# Patient Record
Sex: Female | Born: 1955 | Race: White | Hispanic: No | Marital: Married | Smoking: Current every day smoker
Health system: Southern US, Community
[De-identification: ages and names within clinical notes are randomized; demographics above are authoritative.]

## PROBLEM LIST (undated history)

## (undated) DIAGNOSIS — K3184 Gastroparesis: Secondary | ICD-10-CM

## (undated) DIAGNOSIS — M542 Cervicalgia: Secondary | ICD-10-CM

## (undated) DIAGNOSIS — G43711 Chronic migraine without aura, intractable, with status migrainosus: Secondary | ICD-10-CM

## (undated) DIAGNOSIS — Z8489 Family history of other specified conditions: Secondary | ICD-10-CM

## (undated) DIAGNOSIS — F3289 Other specified depressive episodes: Secondary | ICD-10-CM

## (undated) DIAGNOSIS — R51 Headache: Secondary | ICD-10-CM

## (undated) DIAGNOSIS — I10 Essential (primary) hypertension: Secondary | ICD-10-CM

## (undated) DIAGNOSIS — K219 Gastro-esophageal reflux disease without esophagitis: Secondary | ICD-10-CM

## (undated) DIAGNOSIS — Z9889 Other specified postprocedural states: Secondary | ICD-10-CM

## (undated) DIAGNOSIS — J449 Chronic obstructive pulmonary disease, unspecified: Secondary | ICD-10-CM

## (undated) DIAGNOSIS — R42 Dizziness and giddiness: Secondary | ICD-10-CM

## (undated) DIAGNOSIS — R109 Unspecified abdominal pain: Secondary | ICD-10-CM

## (undated) DIAGNOSIS — M25473 Effusion, unspecified ankle: Secondary | ICD-10-CM

## (undated) DIAGNOSIS — G479 Sleep disorder, unspecified: Secondary | ICD-10-CM

## (undated) DIAGNOSIS — R519 Headache, unspecified: Secondary | ICD-10-CM

## (undated) DIAGNOSIS — F411 Generalized anxiety disorder: Secondary | ICD-10-CM

## (undated) DIAGNOSIS — J4489 Other specified chronic obstructive pulmonary disease: Secondary | ICD-10-CM

## (undated) DIAGNOSIS — E785 Hyperlipidemia, unspecified: Secondary | ICD-10-CM

## (undated) DIAGNOSIS — G932 Benign intracranial hypertension: Secondary | ICD-10-CM

## (undated) DIAGNOSIS — K0530 Chronic periodontitis, unspecified: Secondary | ICD-10-CM

## (undated) DIAGNOSIS — R112 Nausea with vomiting, unspecified: Secondary | ICD-10-CM

## (undated) DIAGNOSIS — F329 Major depressive disorder, single episode, unspecified: Secondary | ICD-10-CM

## (undated) DIAGNOSIS — Z972 Presence of dental prosthetic device (complete) (partial): Secondary | ICD-10-CM

## (undated) DIAGNOSIS — J189 Pneumonia, unspecified organism: Secondary | ICD-10-CM

## (undated) DIAGNOSIS — M810 Age-related osteoporosis without current pathological fracture: Secondary | ICD-10-CM

## (undated) DIAGNOSIS — K859 Acute pancreatitis without necrosis or infection, unspecified: Secondary | ICD-10-CM

## (undated) DIAGNOSIS — G8929 Other chronic pain: Secondary | ICD-10-CM

## (undated) DIAGNOSIS — IMO0002 Reserved for concepts with insufficient information to code with codable children: Secondary | ICD-10-CM

## (undated) HISTORY — DX: Unspecified abdominal pain: R10.9

## (undated) HISTORY — DX: Essential (primary) hypertension: I10

## (undated) HISTORY — DX: Chronic obstructive pulmonary disease, unspecified: J44.9

## (undated) HISTORY — DX: Generalized anxiety disorder: F41.1

## (undated) HISTORY — DX: Benign intracranial hypertension: G93.2

## (undated) HISTORY — DX: Cervicalgia: M54.2

## (undated) HISTORY — DX: Age-related osteoporosis without current pathological fracture: M81.0

## (undated) HISTORY — DX: Other chronic pain: G89.29

## (undated) HISTORY — DX: Gastroparesis: K31.84

## (undated) HISTORY — DX: Gastro-esophageal reflux disease without esophagitis: K21.9

## (undated) HISTORY — DX: Chronic migraine without aura, intractable, with status migrainosus: G43.711

## (undated) HISTORY — DX: Major depressive disorder, single episode, unspecified: F32.9

## (undated) HISTORY — DX: Reserved for concepts with insufficient information to code with codable children: IMO0002

## (undated) HISTORY — DX: Headache: R51

## (undated) HISTORY — DX: Other specified depressive episodes: F32.89

## (undated) HISTORY — DX: Other specified chronic obstructive pulmonary disease: J44.89

## (undated) HISTORY — PX: CHOLECYSTECTOMY: SHX55

## (undated) HISTORY — DX: Headache, unspecified: R51.9

## (undated) HISTORY — DX: Hyperlipidemia, unspecified: E78.5

---

## 1993-05-16 HISTORY — PX: TOTAL ABDOMINAL HYSTERECTOMY: SHX209

## 2003-05-17 HISTORY — PX: BREAST BIOPSY: SHX20

## 2003-06-13 ENCOUNTER — Encounter: Admission: RE | Admit: 2003-06-13 | Discharge: 2003-06-13 | Payer: Self-pay | Admitting: Dentistry

## 2003-06-26 ENCOUNTER — Ambulatory Visit (HOSPITAL_COMMUNITY): Admission: RE | Admit: 2003-06-26 | Discharge: 2003-06-26 | Payer: Self-pay | Admitting: Dentistry

## 2003-08-26 ENCOUNTER — Ambulatory Visit (HOSPITAL_COMMUNITY): Admission: RE | Admit: 2003-08-26 | Discharge: 2003-08-26 | Payer: Self-pay | Admitting: General Surgery

## 2003-08-26 ENCOUNTER — Encounter (INDEPENDENT_AMBULATORY_CARE_PROVIDER_SITE_OTHER): Payer: Self-pay | Admitting: Specialist

## 2003-08-26 ENCOUNTER — Ambulatory Visit (HOSPITAL_BASED_OUTPATIENT_CLINIC_OR_DEPARTMENT_OTHER): Admission: RE | Admit: 2003-08-26 | Discharge: 2003-08-26 | Payer: Self-pay | Admitting: General Surgery

## 2004-03-05 ENCOUNTER — Emergency Department: Payer: Self-pay | Admitting: Unknown Physician Specialty

## 2004-10-30 ENCOUNTER — Emergency Department: Payer: Self-pay | Admitting: Unknown Physician Specialty

## 2005-04-08 ENCOUNTER — Emergency Department: Payer: Self-pay | Admitting: Emergency Medicine

## 2005-09-13 ENCOUNTER — Other Ambulatory Visit: Payer: Self-pay

## 2005-09-13 ENCOUNTER — Inpatient Hospital Stay: Payer: Self-pay | Admitting: Internal Medicine

## 2005-09-14 ENCOUNTER — Other Ambulatory Visit: Payer: Self-pay

## 2005-11-13 ENCOUNTER — Other Ambulatory Visit: Payer: Self-pay

## 2005-11-13 ENCOUNTER — Emergency Department: Payer: Self-pay | Admitting: Emergency Medicine

## 2006-01-21 ENCOUNTER — Other Ambulatory Visit: Payer: Self-pay

## 2006-01-21 ENCOUNTER — Emergency Department: Payer: Self-pay | Admitting: Emergency Medicine

## 2006-03-02 ENCOUNTER — Inpatient Hospital Stay (HOSPITAL_COMMUNITY): Admission: EM | Admit: 2006-03-02 | Discharge: 2006-03-09 | Payer: Self-pay | Admitting: Psychiatry

## 2006-03-02 ENCOUNTER — Ambulatory Visit: Payer: Self-pay | Admitting: Psychiatry

## 2006-03-27 ENCOUNTER — Ambulatory Visit: Payer: Self-pay | Admitting: Psychiatry

## 2006-03-27 ENCOUNTER — Inpatient Hospital Stay (HOSPITAL_COMMUNITY): Admission: AD | Admit: 2006-03-27 | Discharge: 2006-04-12 | Payer: Self-pay | Admitting: Psychiatry

## 2006-04-06 ENCOUNTER — Encounter (HOSPITAL_COMMUNITY): Payer: Self-pay | Admitting: Psychiatry

## 2006-05-12 ENCOUNTER — Emergency Department: Payer: Self-pay | Admitting: Emergency Medicine

## 2006-08-15 ENCOUNTER — Emergency Department: Payer: Self-pay | Admitting: Emergency Medicine

## 2006-08-15 ENCOUNTER — Other Ambulatory Visit: Payer: Self-pay

## 2006-08-29 ENCOUNTER — Emergency Department: Payer: Self-pay | Admitting: Emergency Medicine

## 2006-08-29 ENCOUNTER — Other Ambulatory Visit: Payer: Self-pay

## 2006-09-23 ENCOUNTER — Emergency Department: Payer: Self-pay | Admitting: Unknown Physician Specialty

## 2006-10-24 ENCOUNTER — Emergency Department: Payer: Self-pay | Admitting: Emergency Medicine

## 2007-05-17 HISTORY — PX: OTHER SURGICAL HISTORY: SHX169

## 2007-09-17 ENCOUNTER — Ambulatory Visit: Payer: Self-pay | Admitting: Unknown Physician Specialty

## 2007-10-08 ENCOUNTER — Ambulatory Visit: Payer: Self-pay | Admitting: Urology

## 2008-01-16 ENCOUNTER — Encounter: Payer: Self-pay | Admitting: Internal Medicine

## 2008-01-17 ENCOUNTER — Encounter: Payer: Self-pay | Admitting: Internal Medicine

## 2008-01-18 ENCOUNTER — Encounter: Payer: Self-pay | Admitting: Internal Medicine

## 2008-06-06 ENCOUNTER — Ambulatory Visit: Payer: Self-pay | Admitting: Internal Medicine

## 2008-06-06 DIAGNOSIS — J984 Other disorders of lung: Secondary | ICD-10-CM

## 2008-06-06 DIAGNOSIS — F329 Major depressive disorder, single episode, unspecified: Secondary | ICD-10-CM

## 2008-06-06 LAB — CONVERTED CEMR LAB
Alkaline Phosphatase: 92 units/L (ref 39–117)
Bilirubin, Direct: 0.1 mg/dL (ref 0.0–0.3)
CO2: 29 meq/L (ref 19–32)
GFR calc Af Amer: 85 mL/min
Glucose, Bld: 101 mg/dL — ABNORMAL HIGH (ref 70–99)
INR: 1 (ref 0.8–1.0)
Lymphocytes Relative: 18.1 % (ref 12.0–46.0)
Monocytes Absolute: 0.4 10*3/uL (ref 0.1–1.0)
Monocytes Relative: 3.2 % (ref 3.0–12.0)
Platelets: 266 10*3/uL (ref 150–400)
Potassium: 4.1 meq/L (ref 3.5–5.1)
Prothrombin Time: 10.8 s — ABNORMAL LOW (ref 10.9–13.3)
RDW: 12 % (ref 11.5–14.6)
Sodium: 139 meq/L (ref 135–145)
Total Protein: 6.9 g/dL (ref 6.0–8.3)

## 2008-06-17 ENCOUNTER — Ambulatory Visit: Payer: Self-pay | Admitting: Cardiology

## 2008-06-17 ENCOUNTER — Ambulatory Visit: Payer: Self-pay | Admitting: Internal Medicine

## 2008-06-21 DIAGNOSIS — K219 Gastro-esophageal reflux disease without esophagitis: Secondary | ICD-10-CM | POA: Insufficient documentation

## 2009-01-20 ENCOUNTER — Ambulatory Visit: Payer: Self-pay | Admitting: Internal Medicine

## 2009-03-10 ENCOUNTER — Emergency Department: Payer: Self-pay | Admitting: Emergency Medicine

## 2009-08-31 ENCOUNTER — Observation Stay: Payer: Self-pay | Admitting: Specialist

## 2009-10-15 ENCOUNTER — Emergency Department: Payer: Self-pay | Admitting: Emergency Medicine

## 2009-12-17 ENCOUNTER — Emergency Department: Payer: Self-pay | Admitting: Emergency Medicine

## 2010-01-06 ENCOUNTER — Emergency Department: Payer: Self-pay | Admitting: Emergency Medicine

## 2010-01-20 ENCOUNTER — Encounter: Admission: RE | Admit: 2010-01-20 | Discharge: 2010-01-20 | Payer: Self-pay | Admitting: Neurology

## 2010-02-03 ENCOUNTER — Emergency Department: Payer: Self-pay | Admitting: Emergency Medicine

## 2010-04-14 ENCOUNTER — Emergency Department: Payer: Self-pay | Admitting: Emergency Medicine

## 2010-05-11 ENCOUNTER — Emergency Department: Payer: Self-pay | Admitting: Emergency Medicine

## 2010-05-19 ENCOUNTER — Ambulatory Visit: Payer: Self-pay | Admitting: Neurology

## 2010-05-21 ENCOUNTER — Ambulatory Visit: Payer: Self-pay | Admitting: Neurology

## 2010-09-13 ENCOUNTER — Emergency Department: Payer: Self-pay | Admitting: Internal Medicine

## 2010-10-01 NOTE — Op Note (Signed)
NAME:  Glenda [name], Glenda [name]                         ACCOUNT NO.:  [id number]   MEDICAL RECORD NO.:  [id number]                   PATIENT TYPE:  AMB   LOCATION:  DSC                                  FACILITY:  [facility]   PHYSICIAN:  Adolph Pollack, M.D.            DATE OF BIRTH:  [birth year]   DATE OF PROCEDURE:  08/26/2003  DATE OF DISCHARGE:                                 OPERATIVE REPORT   PREOPERATIVE DIAGNOSIS:  Chronic left groin abscess.   POSTOPERATIVE DIAGNOSIS:  Chronic left groin abscess.   OPERATION PERFORMED:  Excision of chronic left groin abscess.   SURGEON:  Adolph Pollack, M.D.   ANESTHESIA:  Local (1% lidocaine plus 0.5% Marcaine).   INDICATIONS FOR PROCEDURE:  Glenda [name] is [name] 55 year old female who had  problems with recurring abscess in the left groin area.  It was incised and  drained one time.  It continues to recur.  She now presents for excision of  the area.   DESCRIPTION OF PROCEDURE:  She was placed supine on the operating table and  hand in the groin area was shaved.  The area was marked with [name]  and sterilely prepped and draped.  Local anesthetic was infiltrated  superficially and deep.  An elliptical incision was made around the area  that was slightly indurated and carried down to the subcutaneous tissue and  then this was excised.  There was still some firm indurated tissue deep to  this which was excised sharply down to soft tissue.  The two specimens were  then sent to pathology.   Bleeding was controlled with direct pressure and interrupted Vicryl sutures.  Once hemostasis was adequate, the skin was closed loosely with interrupted 4-  0 Prolene simple stitches.  [name] dressing was applied.  The patient tolerated  the procedure well without apparent complications.  She was instructed to  keep the bandage on for three days, then could remove it and shower.  Plan  to see her back in the office in 10 to 14 days for follow-up or sooner if  she has problems.                                               Adolph Pollack, M.D.    Kari Baars  D:  08/26/2003  T:  08/26/2003  Job:  [id number]

## 2010-10-01 NOTE — H&P (Signed)
Glenda [name], Glenda [name]               ACCOUNT NO.:  [id number]   MEDICAL RECORD NO.:  [id number]          PATIENT TYPE:  IPS   LOCATION:  0304                          [facility]ILITY[facility]  [facility]   PHYSICIAN:  Anselm Jungling, MD  DATE OF BIRTH:  1955/10/08   DATE OF ADMISSION:  03/02/2006  DATE OF DISCHARGE:                         PSYCHIATRIC ADMISSION ASSESSMENT   55 year old married white female, voluntarily admit on March 02, 2006.   HISTORY OF PRESENT ILLNESS:  The patient presents with a history depression.  Her depression has been increasing over the past three months.  The patient  states that her depression is affecting her sleep.  The patient states that  she might be up all night and when she does go to bed, she does not want to  get up.  She is also experiencing rageful symptoms for no reason. Denies any  mood swings.  She states her appetite has been satisfactory.  The patient  was having suicidal thoughts with a plan to overdose. Her family got very  concerned and wanted the patient to be admitted for her increasing symptoms.  Her stressors, one of her stressors is the patient feels very guilty over  her son's history of bipolar disorder.   PAST HISTORY:  First admission to [facility].  She was  hospitalized 12 years ago at [facility] in [place] for depressive symptoms.  She has no current outpatient treatment.   SOCIAL HISTORY:  She is a 55 year old married white female, been married for  30 years, has two children, both adult. She lives with her husband.  She is  unemployed and is on disability for her psychiatric illness.   FAMILY HISTORY:  Her son is bipolar, was diagnosed two years ago and is  currently on Risperdal, Celexa and Klonopin. Her sister is also bipolar.   SOCIAL HISTORY:  The patient smokes a pack a day.  She denies any alcohol or  drug use.   Primary care Maxmilian Trostel is Dr. Heloise Ochoa in [place].  Medical  problems are chronic  migraines.   MEDICATIONS:  She has been on Prozac 60 mg since 1989, Klonopin 1 mg t.i.d.,  Nexium 40 mg daily, Topamax 75 mg, she takes that at 1800 for preventative  headache pain, Reglan 10 mg p.r.n., Vicodin 10/650, 1 p.o. q.4-6h.   DRUG ALLERGIES:  The patient reports no known allergies but states is unable  to takes Zomig or Imitrex.   REVIEW OF SYSTEMS:  Denies any fever or chills. Positive for insomnia.  No  chest pain or shortness breath.  No nausea or vomiting. Positive for reflux.  Positive for depression, positive for suicidal thoughts, and positive for  anxiety.   PHYSICAL EXAMINATION:  VITAL SIGNS:  Temperature is 97.9, 67 heart rate, 18 respirations, blood  pressure 113/62, 5 feet 4 inches tall, 158 pounds.  GENERAL:  A middle-aged female in no acute distress, negative  lymphadenopathy, frontal tenderness.  CHEST:  Clear, no wheezing.  BREAST:  Exam was deferred.  HEART:  Regular rate and rhythm.  No murmurs, gallops, or rubs.  ABDOMEN:  Soft, nontender  abdomen.  GU:  Exam was deferred.  EXTREMITIES:  The patient moves all extremities, 5+ against resistance.  No  clubbing, no edema.  SKIN:  Warm and dry without rashes or lacerations.  NEUROLOGICAL:  Findings are intact.  Able to easily perform heel-to-shin,  normal alternating movements.   Her CBC is within normal limits.  CMET within normal limits.  Her BUN is  somewhat low at 4 with an albumin at 3.3.  Urine and urine drug screen are  pending.   MENTAL STATUS EXAM:  She is fully alert, cooperative, fair eye contact.  She  is casually dressed.  Speech is clear, monotone, rambling at times.  Mood is  depressed.  The patient appears flat.  Thought process coherent.  No  evidence of psychosis.  Cognitively intact.  Memory is good.  Judgment is  good.  Insight is fair.  Average intelligence.  Concentration intact.   DIAGNOSIS:  AXIS I:           Major depressive disorder, recurrent, severe.  AXIS II:           Deferred.  AXIS III:         Migraine headaches and gastroesophageal reflux disease.  AXIS IV:          Psychosocial problems related to burden of illness, guilt  associated with son's history of bipolar disorder.  AXIS V:           Current is 35.   PLAN:  To stabilize mood.  Contract for safety.  We will decrease Prozac and  add Cymbalta.  Will have Seroquel for sleep, have Seroquel p.r.n. for  symptoms of irritability.  Will have a family session with husband for  support and concerns. Case manager is to obtain follow-up for the patient.  Tentative length of stay is 5-6 days.      Landry Corporal, N.P.      Anselm Jungling, MD  Electronically Signed    JO/MEDQ  D:  03/02/2006  T:  03/02/2006  Job:  [id number]

## 2010-10-01 NOTE — Discharge Summary (Signed)
NAMEKEVONA, LUPINACCI               ACCOUNT NO.:  [id number]   MEDICAL RECORD NO.:  [id number]          PATIENT TYPE:  IPS   LOCATION:  0504                          FACILITY:  [facility]   PHYSICIAN:  Geoffery Lyons, M.D.      DATE OF BIRTH:  May 25, 1955   DATE OF ADMISSION:  03/27/2006  DATE OF DISCHARGE:  04/12/2006                               DISCHARGE SUMMARY   CHIEF COMPLAINT AND PRESENT ILLNESS:  This was the third admission to  [facility] for this 54 year old white female, married,  voluntarily admitted.  Endorsed severe depression for three weeks since  discharge and has had suicidal thoughts for three days, planning on  overdosing on medication.  Sleep has improved on Seroquel but she is  anhedonic with motor slowing, difficulty completing her ADLs,  spontaneous crying spells, 1-2 per day, for two weeks, neurovegetative  signs, decreased appetite, lack of energy, unable to concentrate on  tasks, persistent thoughts of suicide, episodes of anger, not  necessarily triggered.   PAST PSYCHIATRIC HISTORY:  Dr. Fannie Knee at [facility].  Has been seen at  [facility].  Last admission March 02, 2006 through March 09, 2006, [place] in 1995 for depression.  Had been on Prozac and Wellbutrin.   ALCOHOL/DRUG HISTORY:  Denies active use of any substances.   MEDICAL HISTORY:  Noncontributory.   MEDICATIONS:  Topamax 100 mg for migraines, Cymbalta 30 mg per day,  Nexium 40 mg per day, Reglan 10 mg three times a day, Klonopin 0.5 mg  twice a day and 2 mg at bedtime, Seroquel 50 mg daily.   PHYSICAL EXAMINATION:  Performed and failed to show any acute findings.   LABORATORY DATA:  CBC with white blood cells 10.1, hemoglobin 14.3.  Liver enzymes positive for opioids.  Potassium 4.0, chloride 109,  glucose 83, BUN 5, creatinine 0.9, sodium 142.   MENTAL STATUS EXAM:  Fully alert, cooperative female.  Some psychomotor  retardation.  Speech slow production, soft tone.  Mood  depressed.  Affect depressed.  Thought process with suicidal ideation with plan to  overdose.  Cognition was well-preserved.   ADMISSION DIAGNOSES:  AXIS I:  Major depression, recurrent.  AXIS II:  No diagnosis.  AXIS III:  Gastroesophageal reflux and migraines.  AXIS IV:  Moderate.  AXIS V:  GAF upon admission 30; highest GAF in the last year 67.   HOSPITAL COURSE:  She was admitted.  She was started in individual and  group psychotherapy.  She was maintained on her medications.  Cymbalta  was placed at 60 mg per day.  She was started on Lamictal 25 mg.  Seroquel was increased to 100 mg at bedtime and then 150 mg.  She  admitted that, since discharge, she had not felt like she was getting  any better.  She has been switched from Prozac to Cymbalta.  Began to  feel worse again.  Unstoppable crying.  Did not have an appointment  until May 01, 2006.  Seroquel has helped sleep.  Lived with the  husband who is supportive.  Son was diagnosed bipolar  two years prior to  this admission.  Started complaining of migraines, no sleep, continued  to evidence the depression.  There was some psychomotor retardation.  Speech soft and slow.  Felt that she was not ready to leave last time  she was admitted.  Endorsed depression, unremitting.  She cannot  identify any particular triggers.  Supportive family, husband, grand-  kids.  Did endorse some decrease in her crying spells.  Endorsed no  energy, no motivation.  Did not want to leave the hospital before she  felt she was ready.  She endorsed no active suicidal ideation but she  was having a hard time with anxiety, sleep.  She endorsed that she was  better on the Klonopin 1 mg twice a day and 2 mg at bedtime.  Continued  to endorse headaches, saying that she would have been able to sleep  except for the headache would not allow her.  Endorsed that she had been  dealing with the headache for a long time.  Claimed nausea and vomiting.  We called  her primary care Ajayla Iglesias to call in the treatment for the  headache.  Her physician suggested Vicodin 10 mg/750 mg every six hours  as needed and possibly increasing the Topamax.  She continued to have  the headache, no relief.  Felt that the next step that worked for her  was the Dilaudid and the Phenergan.  We went ahead and tried Dilaudid 4  mg and Phenergan 25 mg x1.  CT scan was ordered.  That did not show any  acute findings.  There was some nausea and some vomiting.  She was  assessed to have possibly a UTI, become malaise, gastrointestinal  distress.  We pursued further evaluation and treatment.  Multiple  somatic complaints.  She claimed that she always felt this way and she  developed some lightheadedness.  We started Seroquel.  We started some  Remeron.  On April 11, 2006, she developed urinary retention.  She  had to be catheterized.  Feeling was that possibly the Cymbalta was the  cause for the urinary retention.  After the catheter was placed.  She  was able to remove the catheter and void by herself.  On April 12, 2006, she was better.  Full contact with reality.  No suicidal or  homicidal ideation.  No delusions.  She felt better than the last time  that she was discharged.  Willing to pursue these medications.  Overall,  her mood improved.  Her affect was brighter.  As she was not suicidal,  we went ahead and discharged to outpatient follow-up.   DISCHARGE DIAGNOSES:  AXIS I:  Major depression, recurrent.  AXIS II:  No diagnosis.  AXIS III:  Migraines, gastroesophageal reflux, status post urinary  retention possibly secondary to Cymbalta.  AXIS IV:  Moderate.  AXIS V:  GAF upon discharge 55-60.   DISCHARGE MEDICATIONS:  1. Topamax 100 mg in the morning.  2. Klonopin 1 mg twice a day and 2 mg at bedtime.  3. Reglan 10 mg daily.  4. Lamictal 25 mg per day.  5. Nexium 40 mg per day.  6. Vicodin 5/500 mg, 1 four times a day as needed. 7. Seroquel 50 mg, 3 at  bedtime.  8. Remeron 45 mg SolTab at bedtime.  9. MiraLax 17 grams daily.  10.Senokot S 1 tab at night.  11.Ambien 10 mg at bedtime for sleep.   FOLLOWUP:  [facility] in [place], [place].  Geoffery Lyons, M.D.  Electronically Signed     IL/MEDQ  D:  05/01/2006  T:  05/01/2006  Job:  [id number]

## 2010-10-01 NOTE — Op Note (Signed)
NAME:  Glenda [name], Glenda [name]                         ACCOUNT NO.:  [id number]   MEDICAL RECORD NO.:  [id number]                   PATIENT TYPE:  AMB   LOCATION:  DAY                                  [name]   PHYSICIAN:  Charlynne Pander, D.D.S.          DATE OF BIRTH:  [birth year]   DATE OF PROCEDURE:  06/26/2003  DATE OF DISCHARGE:                                 OPERATIVE REPORT   PREOPERATIVE DIAGNOSES:  1. Panic disorder.  2. Mitral valve prolapse with regurgitation.  3. Chronic periodontitis.  4. Retained root segment, tooth #5.  5. Malocclusion.  6. Maxillary left tuberosity which was hanging down into the ideal occlusal     plane.   POSTOPERATIVE DIAGNOSES:  1. Panic disorder.  2. Mitral valve prolapse with regurgitation.  3. Chronic periodontitis.  4. Retained root segment, tooth #5.  5. Malocclusion.  6. Maxillary left tuberosity which was hanging down into the ideal occlusal     plane.   OPERATION:  1. Dental examination.  2. Surgical extraction of tooth numbers 3, 5, 13.  3. Two quadrants of alveoplasty.  4. Maxillary left tuberosity reduction.   SURGEON:  Charlynne Pander, D.D.S.   ASSISTANT:  Elliot Dally (Sales executive).   ANESTHESIA:  Monitored anesthesia care which per the anesthesia team.   MEDICATIONS:  1. Clindamycin 600 mg IV prior to invasive dental procedures.  2. Local anesthesia with total utilization of 3 carpules each containing 36     mg of Xylocaine with 0.018 mg of epinephrine as well as 2 carpules each     containing 0 mg of Marcaine with 0.009 mg of epinephrine.   SPECIMENS:  There were 3 teeth which were discarded.   CULTURES:  None.   DRAINS:  None.   COMPLICATIONS:  None.   ESTIMATED BLOOD LOSS:  50 mL.   FLUIDS:  600 mL of lactated Ringer's solution.   INDICATIONS:  The patient had [name] history of [name] panic disorder and mitral valve  prolapse with regurgitation.  The patient was evaluated and treatment  planned to  rule out dental infection which may affect the patient's systemic  health.  The patient was treatment planned for extraction of tooth numbers  3, 5, and 13 with alveoplasty and preprosthetic surgery as indicated.  This  treatment plan was formulated to decrease the risks and complications  associated with dental infection from affecting the patient's systemic  health as well as to assist in prosthodontics rehabilitation for the  patient.   OPERATIVE FINDINGS:  The patient was examined in operating room #6.  The  teeth were identified for extraction.  The patient was noted to be affected  by chronic periodontal disease, presence of retained root segment #5, poor  occlusal scheme, malocclusion.  The aforementioned necessitated the removal  of tooth #3, 5, and 13 with alveoplasty and preprosthetic surgery as  indicated.   DESCRIPTION OF PROCEDURE:  The  patient was brought to the main operating  room #6.  The patient was placed in supine position on the operating room  table.  The patient then had monitored anesthesia care induced per the  anesthesia team.  The oral cavity was thoroughly examined with the findings  as noted above.  The patient was then ready for the oral surgical procedure  as follows:   Local anesthesia was administered sequentially with [name] total utilization of 3  carpules each containing 36 mg of Xylocaine with 0.018 mg of epinephrine as  well as 2 carpules each containing 9 mg of Marcaine with 0.009 mg of  epinephrine.  This local anesthesia was administered sequentially over the 1  hour long procedure.   The maxillary quadrants are first approached.  Anesthesia was delivered as  previously described.  The maxillary right corner was first approached.  [name]  15 blade incision was made from the distal of #2 through the mesial of #6.  Surgical flap was then reflected.  The retained root segment #5 was then  noted.  [name] surgical hand-piece and bur and copious amounts of sterile  saline  was utilized to remove buccal and interseptal bone around tooth #3 and  retained root segment #5 at this time.  The retained root segment was then  elevated out with an elevator without complications.  The surgical hand-  piece and bur were then utilized to section tooth #3 into 3 segments.  These  were elevated out appropriately.  This left [name] retained root tip in the area  of the distal buccal.  This was then elevated out with [name] root tip pick  without complications.  Significant alveoplasty was then performed utilizing  [name] rongeurs and bone file.  The tissues were approximated and trimmed  appropriately.  The surgical site was then irrigated with copious amounts of  sterile saline x 2.  [name] periodontal interrupted suture was then placed on the  mesial aspect of tooth #2 x 1.  The remaining surgical site was then closed  utilizing 3-0 chromic gut suture in [name] continuous interrupted suture  technique from the mesial of #2 through the distal of #6.  [name] separate  periodontal interrupted suture was then placed on the distal aspect of tooth  #6 x 1.   The maxillary left quadrant was then approached.  Anesthesia was given as  previously delivered.  [name] 15 blade incision was made from the distal of the  tuberosity through the distal of tooth #11.  [name] surgical flap was then  reflected. An appropriate amount of buccal and interseptal bone was removed  around tooth #13 at this time.  This was then removed with the 150 forceps  without complication.  Alveoplasty was then performed to the maxillary left  surgical site as indicated.  [name] soft tissue maxillary tuberosity reduction  was then performed on the left side as indicated.  [name] 15 blade and soft  tissue pick-ups was utilized to perform this.  The soft tissue scissors was  then utilized to further recontour the soft tissues as indicated.  The  surgical site was then irrigated with copious amounts of sterile saline. The tissues were approximated and  again trimmed as needed.  The surgical  site was then closed on the mesial aspect by placing 2 interrupted sutures  at the distal of tooth #11.  The remaining surgical site was then closed  utilizing 3-0 chromic gut suture in [name] continuous interrupted suture  technique from the distal  of the tuberosity to the distal of #11  appropriately.   The entire mouth was then irrigated with copious amounts of sterile saline.  The patient was examined for complications, seeing none, the dental medicine  procedure was deemed to be complete.  [name] series of 4 x 4 gauzes were placed  in the patient's mouth to aid hemostasis at this time.  The patient was then  handed over to the anesthesia team for final disposition.  After an  appropriate amount of time, the patient was taken to the postanesthesia care  unit with stable vital signs and [name] good oxygenation level.  All counts were  correct for the dental medicine procedure.   The patient will follow up with dental medicine in approximately 1 week for  evaluation for suture removal.  The patient will be given appropriate pain  medication with Fiorinal with codeine No. 3 as indicated.  The patient was  noted to have an abscess in the left inguinal area.  The patient has been  instructed to follow up with [facility] as indicated.  In the meantime, the patient will maintain her  Augmentin therapy as prescribed by Dr. Nolon Rod Monguilod at the time of  the physical exam prior to the oral surgical procedure.                                               Charlynne Pander, D.D.S.    RFK/MEDQ  D:  06/26/2003  T:  06/26/2003  Job:  [id number]   cc:   Leeanne Mannan, MD  [facility].   Rosanne Sack, M.D.  [address]  [place], [place] [address]  Fax: [phone]

## 2010-10-01 NOTE — Discharge Summary (Signed)
[name], [name]               ACCOUNT NO.:  [id number]   MEDICAL RECORD NO.:  [id number]          PATIENT TYPE:  IPS   LOCATION:  0304                          FACILITY:  [facility]   PHYSICIAN:  Anselm Jungling, MD  DATE OF BIRTH:  04/03/1956   DATE OF ADMISSION:  03/02/2006  DATE OF DISCHARGE:  03/09/2006                                 DISCHARGE SUMMARY   IDENTIFYING DATA/REASON FOR ADMISSION:  This was an inpatient psychiatric  admission for Glenda [name], a 55 year old married white female admitted with  increasing chronic depression, and suicidal ideation with plan.  Her  depression had been increasing over the previous three months, and was  affecting her sleep severely.  She was also experiencing increased  irritability for no reason that she could identify.  She denied mood swings.  Her suicidal plan involved the thought of overdosing.  Her family got very  concerned and wanted her admitted on an inpatient basis for these increasing  symptoms.  It was noted that her son has a history of bipolar disorder.  This was her first admission to our [facility].  She was  hospitalized psychiatrically 12 years prior at [facility] for  depressive symptoms.  She was not involved in any outpatient treatment.  However, she came to [place] on a combination of Prozac, Topamax and Klonopin,  from her primary care physician.  Please refer to the admission note for  further details pertaining to the symptoms, circumstances and history that  led to her hospitalization.   INITIAL DIAGNOSTIC IMPRESSION:  She was given initial AXIS I diagnosis of  major depressive disorder, recurrent without psychotic features, and  insomnia.   MEDICAL/LABORATORY:  The patient was medically and physically assessed by  the psychiatric nurse practitioner.  She came to [place] on Nexium and Reglan for  GERD, and Vicodin as needed for pain.  She had a history of GERD.  She had a  history of migraine headaches.   There were no acute medical issues during  this brief inpatient stay, although she did have some problems with headache  which were addressed with appropriate medication successfully.   HOSPITAL COURSE:  The patient was admitted to the adult inpatient  psychiatric service.  She presented as a well-nourished, well-developed and  well-organized woman who was fully oriented.  She was a pleasant and  cooperative individual, but depressed, and sad with constricted affect.  There were no signs or symptoms of psychosis.  She denied any suicidal  thought or intention during her entire stay.   The patient had been taking Prozac, but apparently without good results.  As  a result, a decision was made to do a trial of Cymbalta which might also  help to address underlying chronic pain.  She was started on Cymbalta 30 mg  daily.  Prozac was discontinued.  She was continued on Klonopin 0.5 mg  b.i.d. and 2 mg q.h.s..  Topamax was continued, with a slightly increased  dose to 100 mg daily.  These were the only psychotropic medication changes  affected.   The patient participated in  various therapeutic groups and activities.  She  demonstrated good insight and judgment, but had difficulty making progress  with respect to her mood.  Throughout her stay, she complained of being  worried and anxious about how she would do at home following her discharge.   On the eighth hospital day, the patient was felt to be appropriate for  discharge.  Although she still had some anxieties about returning home, she  agreed with [place] that continuing her inpatient stay was not likely to change  her level of apprehension, which to some degree was natural and appropriate.  She was absent suicidal ideation, was eating and sleeping reasonably well.   AFTERCARE:  The patient was to follow up at [facility], with an  appointment with Lorre Nick on March 16, 2006.   DISCHARGE MEDICATIONS:  1. Klonopin 0.5 mg  twice daily and 2 mg q.h.s.  2. Topamax 100 mg daily.  3. Cymbalta 30 mg daily.  4. Seroquel 50 mg q.h.s.  5. Nexium 40 mg daily.   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  AXIS II:  Deferred.  AXIS III:  History of gastroesophageal reflux disease, chronic pain.  AXIS IV:  Stressors:  Severe.  AXIS V:  GAF on discharge 70.      Anselm Jungling, MD  Electronically Signed     SPB/MEDQ  D:  03/10/2006  T:  03/11/2006  Job:  [id number]

## 2011-06-23 DIAGNOSIS — S0993XA Unspecified injury of face, initial encounter: Secondary | ICD-10-CM | POA: Diagnosis not present

## 2011-06-23 DIAGNOSIS — M542 Cervicalgia: Secondary | ICD-10-CM | POA: Diagnosis not present

## 2011-06-23 DIAGNOSIS — R197 Diarrhea, unspecified: Secondary | ICD-10-CM | POA: Diagnosis not present

## 2011-06-23 DIAGNOSIS — K3580 Unspecified acute appendicitis: Secondary | ICD-10-CM | POA: Diagnosis not present

## 2011-06-23 DIAGNOSIS — S199XXA Unspecified injury of neck, initial encounter: Secondary | ICD-10-CM | POA: Diagnosis not present

## 2011-06-23 DIAGNOSIS — R52 Pain, unspecified: Secondary | ICD-10-CM | POA: Diagnosis not present

## 2011-06-23 DIAGNOSIS — R109 Unspecified abdominal pain: Secondary | ICD-10-CM | POA: Diagnosis not present

## 2011-06-23 DIAGNOSIS — R6889 Other general symptoms and signs: Secondary | ICD-10-CM | POA: Diagnosis not present

## 2011-06-23 LAB — URINALYSIS, COMPLETE
Glucose,UR: NEGATIVE mg/dL (ref 0–75)
Ketone: NEGATIVE
Leukocyte Esterase: NEGATIVE
Nitrite: NEGATIVE
Ph: 6 (ref 4.5–8.0)
Protein: NEGATIVE
RBC,UR: <1 /HPF (ref 0–5)
Squamous Epithelial: <1
WBC UR: NONE SEEN /HPF (ref 0–5)

## 2011-06-23 LAB — COMPREHENSIVE METABOLIC PANEL
Alkaline Phosphatase: 103 U/L (ref 50–136)
Anion Gap: 11 (ref 7–16)
BUN: 7 mg/dL (ref 7–18)
Calcium, Total: 9.3 mg/dL (ref 8.5–10.1)
Chloride: 105 mmol/L (ref 98–107)
EGFR (African American): >60
Glucose: 79 mg/dL (ref 65–99)
Potassium: 4.4 mmol/L (ref 3.5–5.1)
SGOT(AST): 23 U/L (ref 15–37)
SGPT (ALT): 20 U/L
Total Protein: 7.7 g/dL (ref 6.4–8.2)

## 2011-06-23 LAB — DRUG SCREEN, URINE
Amphetamines, Ur Screen: NEGATIVE (ref ?–1000)
Benzodiazepine, Ur Scrn: NEGATIVE (ref ?–200)
Cocaine Metabolite,Ur ~~LOC~~: NEGATIVE (ref ?–300)
MDMA (Ecstasy)Ur Screen: NEGATIVE (ref ?–500)
Tricyclic, Ur Screen: NEGATIVE (ref ?–1000)

## 2011-06-23 LAB — ETHANOL
Ethanol %: <0.003 % (ref 0.000–0.080)
Ethanol: <3 mg/dL

## 2011-06-23 LAB — CK-MB: CK-MB: <0.5 ng/mL — ABNORMAL LOW (ref 0.5–3.6)

## 2011-06-23 LAB — CBC
HGB: 15.1 g/dL (ref 12.0–16.0)
MCH: 32.1 pg (ref 26.0–34.0)
MCV: 95 fL (ref 80–100)
Platelet: 253 10*3/uL (ref 150–440)
RBC: 4.69 10*6/uL (ref 3.80–5.20)
WBC: 9 10*3/uL (ref 3.6–11.0)

## 2011-06-23 LAB — CK: CK, Total: 71 U/L (ref 21–215)

## 2011-06-24 ENCOUNTER — Inpatient Hospital Stay: Payer: Self-pay | Admitting: Psychiatry

## 2011-06-24 DIAGNOSIS — R109 Unspecified abdominal pain: Secondary | ICD-10-CM | POA: Diagnosis not present

## 2011-06-24 DIAGNOSIS — R197 Diarrhea, unspecified: Secondary | ICD-10-CM | POA: Diagnosis not present

## 2011-06-24 DIAGNOSIS — R52 Pain, unspecified: Secondary | ICD-10-CM | POA: Diagnosis not present

## 2011-06-24 DIAGNOSIS — F312 Bipolar disorder, current episode manic severe with psychotic features: Secondary | ICD-10-CM | POA: Diagnosis not present

## 2011-06-24 DIAGNOSIS — K3580 Unspecified acute appendicitis: Secondary | ICD-10-CM | POA: Diagnosis not present

## 2011-06-25 DIAGNOSIS — F312 Bipolar disorder, current episode manic severe with psychotic features: Secondary | ICD-10-CM | POA: Diagnosis not present

## 2011-06-26 DIAGNOSIS — F312 Bipolar disorder, current episode manic severe with psychotic features: Secondary | ICD-10-CM | POA: Diagnosis not present

## 2011-06-27 DIAGNOSIS — F312 Bipolar disorder, current episode manic severe with psychotic features: Secondary | ICD-10-CM | POA: Diagnosis not present

## 2011-06-28 DIAGNOSIS — F312 Bipolar disorder, current episode manic severe with psychotic features: Secondary | ICD-10-CM | POA: Diagnosis not present

## 2011-07-29 DIAGNOSIS — Z79899 Other long term (current) drug therapy: Secondary | ICD-10-CM | POA: Diagnosis not present

## 2011-07-29 DIAGNOSIS — F17200 Nicotine dependence, unspecified, uncomplicated: Secondary | ICD-10-CM | POA: Diagnosis not present

## 2011-07-29 DIAGNOSIS — E538 Deficiency of other specified B group vitamins: Secondary | ICD-10-CM | POA: Diagnosis not present

## 2011-07-29 DIAGNOSIS — E7800 Pure hypercholesterolemia, unspecified: Secondary | ICD-10-CM | POA: Diagnosis not present

## 2011-07-29 DIAGNOSIS — J449 Chronic obstructive pulmonary disease, unspecified: Secondary | ICD-10-CM | POA: Diagnosis not present

## 2011-07-29 DIAGNOSIS — R5383 Other fatigue: Secondary | ICD-10-CM | POA: Diagnosis not present

## 2011-07-29 DIAGNOSIS — R5381 Other malaise: Secondary | ICD-10-CM | POA: Diagnosis not present

## 2011-08-01 DIAGNOSIS — Z79899 Other long term (current) drug therapy: Secondary | ICD-10-CM | POA: Diagnosis not present

## 2011-11-16 DIAGNOSIS — E785 Hyperlipidemia, unspecified: Secondary | ICD-10-CM | POA: Diagnosis not present

## 2011-11-16 DIAGNOSIS — F329 Major depressive disorder, single episode, unspecified: Secondary | ICD-10-CM | POA: Diagnosis not present

## 2011-11-16 DIAGNOSIS — R51 Headache: Secondary | ICD-10-CM | POA: Diagnosis not present

## 2011-11-16 DIAGNOSIS — Z780 Asymptomatic menopausal state: Secondary | ICD-10-CM | POA: Diagnosis not present

## 2012-01-10 DIAGNOSIS — E785 Hyperlipidemia, unspecified: Secondary | ICD-10-CM | POA: Diagnosis not present

## 2012-01-10 DIAGNOSIS — R51 Headache: Secondary | ICD-10-CM | POA: Diagnosis not present

## 2012-01-10 DIAGNOSIS — G44229 Chronic tension-type headache, not intractable: Secondary | ICD-10-CM | POA: Diagnosis not present

## 2012-01-10 DIAGNOSIS — G4440 Drug-induced headache, not elsewhere classified, not intractable: Secondary | ICD-10-CM | POA: Diagnosis not present

## 2012-01-10 DIAGNOSIS — IMO0001 Reserved for inherently not codable concepts without codable children: Secondary | ICD-10-CM | POA: Diagnosis not present

## 2012-01-10 DIAGNOSIS — F329 Major depressive disorder, single episode, unspecified: Secondary | ICD-10-CM | POA: Diagnosis not present

## 2012-04-10 DIAGNOSIS — Z23 Encounter for immunization: Secondary | ICD-10-CM | POA: Diagnosis not present

## 2012-04-10 DIAGNOSIS — E7800 Pure hypercholesterolemia, unspecified: Secondary | ICD-10-CM | POA: Diagnosis not present

## 2012-05-14 ENCOUNTER — Inpatient Hospital Stay: Payer: Self-pay | Admitting: Psychiatry

## 2012-05-14 DIAGNOSIS — R45851 Suicidal ideations: Secondary | ICD-10-CM | POA: Diagnosis not present

## 2012-05-14 DIAGNOSIS — F39 Unspecified mood [affective] disorder: Secondary | ICD-10-CM | POA: Diagnosis not present

## 2012-05-14 DIAGNOSIS — F329 Major depressive disorder, single episode, unspecified: Secondary | ICD-10-CM | POA: Diagnosis not present

## 2012-05-14 LAB — ETHANOL
Ethanol %: <0.003 % (ref 0.000–0.080)
Ethanol: <3 mg/dL

## 2012-05-14 LAB — CBC
HCT: 43.6 % (ref 35.0–47.0)
HGB: 14.6 g/dL (ref 12.0–16.0)
MCHC: 33.5 g/dL (ref 32.0–36.0)
MCV: 94 fL (ref 80–100)
RBC: 4.65 10*6/uL (ref 3.80–5.20)
WBC: 7.8 10*3/uL (ref 3.6–11.0)

## 2012-05-14 LAB — URINALYSIS, COMPLETE
Bacteria: NONE SEEN
Bilirubin,UR: NEGATIVE
Glucose,UR: NEGATIVE mg/dL (ref 0–75)
Ketone: NEGATIVE
Leukocyte Esterase: NEGATIVE
RBC,UR: 1 /HPF (ref 0–5)
WBC UR: <1 /HPF (ref 0–5)

## 2012-05-14 LAB — DRUG SCREEN, URINE
Barbiturates, Ur Screen: POSITIVE (ref ?–200)
Benzodiazepine, Ur Scrn: NEGATIVE (ref ?–200)
Cocaine Metabolite,Ur ~~LOC~~: NEGATIVE (ref ?–300)
MDMA (Ecstasy)Ur Screen: NEGATIVE (ref ?–500)
Methadone, Ur Screen: NEGATIVE (ref ?–300)
Opiate, Ur Screen: NEGATIVE (ref ?–300)

## 2012-05-14 LAB — COMPREHENSIVE METABOLIC PANEL
Anion Gap: 8 (ref 7–16)
BUN: 5 mg/dL — ABNORMAL LOW (ref 7–18)
Bilirubin,Total: <0.1 mg/dL — ABNORMAL LOW (ref 0.2–1.0)
Calcium, Total: 8.5 mg/dL (ref 8.5–10.1)
Chloride: 105 mmol/L (ref 98–107)
Creatinine: 1.05 mg/dL (ref 0.60–1.30)
EGFR (African American): >60
Glucose: 93 mg/dL (ref 65–99)
Potassium: 3.7 mmol/L (ref 3.5–5.1)
SGOT(AST): 17 U/L (ref 15–37)
Total Protein: 7.2 g/dL (ref 6.4–8.2)

## 2012-05-14 LAB — SALICYLATE LEVEL: Salicylates, Serum: 2.2 mg/dL

## 2012-05-14 LAB — ACETAMINOPHEN LEVEL: Acetaminophen: 16 ug/mL

## 2012-05-15 DIAGNOSIS — F39 Unspecified mood [affective] disorder: Secondary | ICD-10-CM | POA: Diagnosis not present

## 2012-05-17 DIAGNOSIS — F39 Unspecified mood [affective] disorder: Secondary | ICD-10-CM | POA: Diagnosis not present

## 2012-06-05 DIAGNOSIS — F332 Major depressive disorder, recurrent severe without psychotic features: Secondary | ICD-10-CM | POA: Diagnosis not present

## 2012-06-09 ENCOUNTER — Emergency Department: Payer: Self-pay | Admitting: Internal Medicine

## 2012-06-09 DIAGNOSIS — G319 Degenerative disease of nervous system, unspecified: Secondary | ICD-10-CM | POA: Diagnosis not present

## 2012-06-09 DIAGNOSIS — R51 Headache: Secondary | ICD-10-CM | POA: Diagnosis not present

## 2012-06-09 DIAGNOSIS — G43909 Migraine, unspecified, not intractable, without status migrainosus: Secondary | ICD-10-CM | POA: Diagnosis not present

## 2012-06-17 ENCOUNTER — Emergency Department: Payer: Self-pay | Admitting: Emergency Medicine

## 2012-06-17 DIAGNOSIS — R51 Headache: Secondary | ICD-10-CM | POA: Diagnosis not present

## 2012-06-17 LAB — CSF CELL CT + PROT + GLU PANEL
CSF Tube #: 1
Eosinophil: 0 %
Lymphocytes: 83 %
Monocytes/Macrophages: 17 %
Neutrophils: 0 %
Other Cells: 0 %
Protein, CSF: 30 mg/dL (ref 15–45)
RBC (CSF): 7 /mm3
WBC (CSF): 2 /mm3

## 2012-06-17 LAB — CSF CELL COUNT WITH DIFFERENTIAL
Eosinophil: 0 %
Lymphocytes: 0 %
Monocytes/Macrophages: 0 %
Neutrophils: 0 %
RBC (CSF): 2 /mm3
WBC (CSF): 0 /mm3

## 2012-06-20 LAB — CSF CULTURE

## 2012-06-25 DIAGNOSIS — G4440 Drug-induced headache, not elsewhere classified, not intractable: Secondary | ICD-10-CM | POA: Diagnosis not present

## 2012-06-25 DIAGNOSIS — M542 Cervicalgia: Secondary | ICD-10-CM | POA: Diagnosis not present

## 2012-06-26 DIAGNOSIS — G4440 Drug-induced headache, not elsewhere classified, not intractable: Secondary | ICD-10-CM | POA: Diagnosis not present

## 2012-06-26 DIAGNOSIS — M542 Cervicalgia: Secondary | ICD-10-CM | POA: Diagnosis not present

## 2012-07-31 DIAGNOSIS — I10 Essential (primary) hypertension: Secondary | ICD-10-CM | POA: Diagnosis not present

## 2012-07-31 DIAGNOSIS — E049 Nontoxic goiter, unspecified: Secondary | ICD-10-CM | POA: Diagnosis not present

## 2012-07-31 DIAGNOSIS — F3289 Other specified depressive episodes: Secondary | ICD-10-CM | POA: Diagnosis not present

## 2012-07-31 DIAGNOSIS — R5383 Other fatigue: Secondary | ICD-10-CM | POA: Diagnosis not present

## 2012-07-31 DIAGNOSIS — F17200 Nicotine dependence, unspecified, uncomplicated: Secondary | ICD-10-CM | POA: Diagnosis not present

## 2012-07-31 DIAGNOSIS — Z0000 Encounter for general adult medical examination without abnormal findings: Secondary | ICD-10-CM | POA: Diagnosis not present

## 2012-07-31 DIAGNOSIS — R5381 Other malaise: Secondary | ICD-10-CM | POA: Diagnosis not present

## 2012-08-31 DIAGNOSIS — J449 Chronic obstructive pulmonary disease, unspecified: Secondary | ICD-10-CM | POA: Diagnosis not present

## 2012-08-31 DIAGNOSIS — I10 Essential (primary) hypertension: Secondary | ICD-10-CM | POA: Diagnosis not present

## 2012-08-31 DIAGNOSIS — G540 Brachial plexus disorders: Secondary | ICD-10-CM | POA: Diagnosis not present

## 2012-08-31 DIAGNOSIS — J4489 Other specified chronic obstructive pulmonary disease: Secondary | ICD-10-CM | POA: Diagnosis not present

## 2012-09-14 DIAGNOSIS — R109 Unspecified abdominal pain: Secondary | ICD-10-CM | POA: Diagnosis not present

## 2012-09-14 DIAGNOSIS — F17200 Nicotine dependence, unspecified, uncomplicated: Secondary | ICD-10-CM | POA: Diagnosis not present

## 2012-10-03 DIAGNOSIS — R011 Cardiac murmur, unspecified: Secondary | ICD-10-CM | POA: Diagnosis not present

## 2012-10-03 DIAGNOSIS — R109 Unspecified abdominal pain: Secondary | ICD-10-CM | POA: Diagnosis not present

## 2012-10-03 DIAGNOSIS — R51 Headache: Secondary | ICD-10-CM | POA: Diagnosis not present

## 2012-10-03 DIAGNOSIS — R112 Nausea with vomiting, unspecified: Secondary | ICD-10-CM | POA: Diagnosis not present

## 2012-10-03 DIAGNOSIS — R079 Chest pain, unspecified: Secondary | ICD-10-CM | POA: Diagnosis not present

## 2012-10-03 DIAGNOSIS — I517 Cardiomegaly: Secondary | ICD-10-CM | POA: Diagnosis not present

## 2012-10-04 ENCOUNTER — Emergency Department: Payer: Self-pay | Admitting: Emergency Medicine

## 2012-10-04 DIAGNOSIS — Z9089 Acquired absence of other organs: Secondary | ICD-10-CM | POA: Diagnosis not present

## 2012-10-04 DIAGNOSIS — R109 Unspecified abdominal pain: Secondary | ICD-10-CM | POA: Diagnosis not present

## 2012-10-04 DIAGNOSIS — R9431 Abnormal electrocardiogram [ECG] [EKG]: Secondary | ICD-10-CM | POA: Diagnosis not present

## 2012-10-04 DIAGNOSIS — R6889 Other general symptoms and signs: Secondary | ICD-10-CM | POA: Diagnosis not present

## 2012-10-04 DIAGNOSIS — R52 Pain, unspecified: Secondary | ICD-10-CM | POA: Diagnosis not present

## 2012-10-04 LAB — URINALYSIS, COMPLETE
Bilirubin,UR: NEGATIVE
Glucose,UR: NEGATIVE mg/dL (ref 0–75)
Ph: 6 (ref 4.5–8.0)
RBC,UR: <1 /HPF (ref 0–5)
Specific Gravity: 1.002 (ref 1.003–1.030)
Squamous Epithelial: <1
WBC UR: <1 /HPF (ref 0–5)

## 2012-10-04 LAB — CBC
HCT: 39.7 % (ref 35.0–47.0)
HGB: 13.8 g/dL (ref 12.0–16.0)
MCHC: 34.7 g/dL (ref 32.0–36.0)
MCV: 93 fL (ref 80–100)
Platelet: 279 10*3/uL (ref 150–440)
RBC: 4.28 10*6/uL (ref 3.80–5.20)
WBC: 9 10*3/uL (ref 3.6–11.0)

## 2012-10-04 LAB — COMPREHENSIVE METABOLIC PANEL
Albumin: 3.9 g/dL (ref 3.4–5.0)
Alkaline Phosphatase: 108 U/L (ref 50–136)
BUN: 4 mg/dL — ABNORMAL LOW (ref 7–18)
Chloride: 101 mmol/L (ref 98–107)
Creatinine: 0.91 mg/dL (ref 0.60–1.30)
EGFR (African American): >60
EGFR (Non-African Amer.): >60
Glucose: 91 mg/dL (ref 65–99)
Potassium: 3.4 mmol/L — ABNORMAL LOW (ref 3.5–5.1)
SGOT(AST): 17 U/L (ref 15–37)

## 2012-10-04 LAB — LIPASE, BLOOD: Lipase: 106 U/L (ref 73–393)

## 2012-10-11 DIAGNOSIS — R109 Unspecified abdominal pain: Secondary | ICD-10-CM | POA: Diagnosis not present

## 2012-10-11 DIAGNOSIS — K5900 Constipation, unspecified: Secondary | ICD-10-CM | POA: Diagnosis not present

## 2012-10-11 DIAGNOSIS — R112 Nausea with vomiting, unspecified: Secondary | ICD-10-CM | POA: Diagnosis not present

## 2012-10-11 DIAGNOSIS — K219 Gastro-esophageal reflux disease without esophagitis: Secondary | ICD-10-CM | POA: Diagnosis not present

## 2012-10-17 DIAGNOSIS — K2940 Chronic atrophic gastritis without bleeding: Secondary | ICD-10-CM | POA: Diagnosis not present

## 2012-10-17 DIAGNOSIS — K449 Diaphragmatic hernia without obstruction or gangrene: Secondary | ICD-10-CM | POA: Diagnosis not present

## 2012-10-17 DIAGNOSIS — R112 Nausea with vomiting, unspecified: Secondary | ICD-10-CM | POA: Diagnosis not present

## 2012-10-17 DIAGNOSIS — K208 Other esophagitis without bleeding: Secondary | ICD-10-CM | POA: Diagnosis not present

## 2012-10-17 DIAGNOSIS — K319 Disease of stomach and duodenum, unspecified: Secondary | ICD-10-CM | POA: Diagnosis not present

## 2012-10-17 DIAGNOSIS — R109 Unspecified abdominal pain: Secondary | ICD-10-CM | POA: Diagnosis not present

## 2012-10-25 DIAGNOSIS — R51 Headache: Secondary | ICD-10-CM | POA: Diagnosis not present

## 2012-10-25 DIAGNOSIS — G894 Chronic pain syndrome: Secondary | ICD-10-CM | POA: Diagnosis not present

## 2012-10-25 DIAGNOSIS — Z87891 Personal history of nicotine dependence: Secondary | ICD-10-CM | POA: Diagnosis not present

## 2012-10-25 DIAGNOSIS — F17200 Nicotine dependence, unspecified, uncomplicated: Secondary | ICD-10-CM | POA: Diagnosis not present

## 2012-11-08 DIAGNOSIS — R51 Headache: Secondary | ICD-10-CM | POA: Diagnosis not present

## 2012-11-08 DIAGNOSIS — F17200 Nicotine dependence, unspecified, uncomplicated: Secondary | ICD-10-CM | POA: Diagnosis not present

## 2012-11-08 DIAGNOSIS — G894 Chronic pain syndrome: Secondary | ICD-10-CM | POA: Diagnosis not present

## 2012-11-08 DIAGNOSIS — Z87891 Personal history of nicotine dependence: Secondary | ICD-10-CM | POA: Diagnosis not present

## 2012-11-21 DIAGNOSIS — R51 Headache: Secondary | ICD-10-CM | POA: Diagnosis not present

## 2012-11-21 DIAGNOSIS — G894 Chronic pain syndrome: Secondary | ICD-10-CM | POA: Diagnosis not present

## 2012-12-07 DIAGNOSIS — G894 Chronic pain syndrome: Secondary | ICD-10-CM | POA: Diagnosis not present

## 2012-12-07 DIAGNOSIS — Z79899 Other long term (current) drug therapy: Secondary | ICD-10-CM | POA: Diagnosis not present

## 2012-12-07 DIAGNOSIS — Z87891 Personal history of nicotine dependence: Secondary | ICD-10-CM | POA: Diagnosis not present

## 2012-12-07 DIAGNOSIS — R51 Headache: Secondary | ICD-10-CM | POA: Diagnosis not present

## 2012-12-07 DIAGNOSIS — F17200 Nicotine dependence, unspecified, uncomplicated: Secondary | ICD-10-CM | POA: Diagnosis not present

## 2012-12-20 DIAGNOSIS — R42 Dizziness and giddiness: Secondary | ICD-10-CM | POA: Diagnosis not present

## 2012-12-20 DIAGNOSIS — E559 Vitamin D deficiency, unspecified: Secondary | ICD-10-CM | POA: Diagnosis not present

## 2012-12-20 DIAGNOSIS — E7800 Pure hypercholesterolemia, unspecified: Secondary | ICD-10-CM | POA: Diagnosis not present

## 2012-12-20 DIAGNOSIS — G43909 Migraine, unspecified, not intractable, without status migrainosus: Secondary | ICD-10-CM | POA: Diagnosis not present

## 2012-12-20 DIAGNOSIS — Z79899 Other long term (current) drug therapy: Secondary | ICD-10-CM | POA: Diagnosis not present

## 2012-12-20 DIAGNOSIS — F17200 Nicotine dependence, unspecified, uncomplicated: Secondary | ICD-10-CM | POA: Diagnosis not present

## 2012-12-20 DIAGNOSIS — R5381 Other malaise: Secondary | ICD-10-CM | POA: Diagnosis not present

## 2012-12-20 DIAGNOSIS — F411 Generalized anxiety disorder: Secondary | ICD-10-CM | POA: Diagnosis not present

## 2012-12-20 DIAGNOSIS — F329 Major depressive disorder, single episode, unspecified: Secondary | ICD-10-CM | POA: Diagnosis not present

## 2013-01-02 DIAGNOSIS — Z049 Encounter for examination and observation for unspecified reason: Secondary | ICD-10-CM | POA: Diagnosis not present

## 2013-01-02 DIAGNOSIS — R51 Headache: Secondary | ICD-10-CM | POA: Diagnosis not present

## 2013-01-18 DIAGNOSIS — Z79899 Other long term (current) drug therapy: Secondary | ICD-10-CM | POA: Diagnosis not present

## 2013-01-18 DIAGNOSIS — R51 Headache: Secondary | ICD-10-CM | POA: Diagnosis not present

## 2013-01-18 DIAGNOSIS — Z87891 Personal history of nicotine dependence: Secondary | ICD-10-CM | POA: Diagnosis not present

## 2013-01-18 DIAGNOSIS — G894 Chronic pain syndrome: Secondary | ICD-10-CM | POA: Diagnosis not present

## 2013-01-18 DIAGNOSIS — F17200 Nicotine dependence, unspecified, uncomplicated: Secondary | ICD-10-CM | POA: Diagnosis not present

## 2013-02-20 DIAGNOSIS — F17200 Nicotine dependence, unspecified, uncomplicated: Secondary | ICD-10-CM | POA: Diagnosis not present

## 2013-02-20 DIAGNOSIS — F411 Generalized anxiety disorder: Secondary | ICD-10-CM | POA: Diagnosis not present

## 2013-02-20 DIAGNOSIS — Z23 Encounter for immunization: Secondary | ICD-10-CM | POA: Diagnosis not present

## 2013-02-20 DIAGNOSIS — G43909 Migraine, unspecified, not intractable, without status migrainosus: Secondary | ICD-10-CM | POA: Diagnosis not present

## 2013-02-20 DIAGNOSIS — J449 Chronic obstructive pulmonary disease, unspecified: Secondary | ICD-10-CM | POA: Diagnosis not present

## 2013-02-20 DIAGNOSIS — F329 Major depressive disorder, single episode, unspecified: Secondary | ICD-10-CM | POA: Diagnosis not present

## 2013-02-20 DIAGNOSIS — R51 Headache: Secondary | ICD-10-CM | POA: Diagnosis not present

## 2013-03-13 DIAGNOSIS — M5030 Other cervical disc degeneration, unspecified cervical region: Secondary | ICD-10-CM | POA: Diagnosis not present

## 2013-03-13 DIAGNOSIS — M545 Low back pain, unspecified: Secondary | ICD-10-CM | POA: Diagnosis not present

## 2013-03-13 DIAGNOSIS — M47817 Spondylosis without myelopathy or radiculopathy, lumbosacral region: Secondary | ICD-10-CM | POA: Diagnosis not present

## 2013-03-13 DIAGNOSIS — M5020 Other cervical disc displacement, unspecified cervical region: Secondary | ICD-10-CM | POA: Diagnosis not present

## 2013-03-13 DIAGNOSIS — M47812 Spondylosis without myelopathy or radiculopathy, cervical region: Secondary | ICD-10-CM | POA: Diagnosis not present

## 2013-03-13 DIAGNOSIS — M542 Cervicalgia: Secondary | ICD-10-CM | POA: Diagnosis not present

## 2013-03-13 DIAGNOSIS — M5126 Other intervertebral disc displacement, lumbar region: Secondary | ICD-10-CM | POA: Diagnosis not present

## 2013-03-25 DIAGNOSIS — Z5181 Encounter for therapeutic drug level monitoring: Secondary | ICD-10-CM | POA: Diagnosis not present

## 2013-03-25 DIAGNOSIS — Z87891 Personal history of nicotine dependence: Secondary | ICD-10-CM | POA: Diagnosis not present

## 2013-03-25 DIAGNOSIS — Z79899 Other long term (current) drug therapy: Secondary | ICD-10-CM | POA: Diagnosis not present

## 2013-03-25 DIAGNOSIS — I10 Essential (primary) hypertension: Secondary | ICD-10-CM | POA: Diagnosis not present

## 2013-03-25 DIAGNOSIS — G894 Chronic pain syndrome: Secondary | ICD-10-CM | POA: Diagnosis not present

## 2013-03-25 DIAGNOSIS — F17200 Nicotine dependence, unspecified, uncomplicated: Secondary | ICD-10-CM | POA: Diagnosis not present

## 2013-03-25 DIAGNOSIS — R51 Headache: Secondary | ICD-10-CM | POA: Diagnosis not present

## 2013-04-15 DIAGNOSIS — J189 Pneumonia, unspecified organism: Secondary | ICD-10-CM

## 2013-04-15 HISTORY — DX: Pneumonia, unspecified organism: J18.9

## 2013-04-17 DIAGNOSIS — R51 Headache: Secondary | ICD-10-CM | POA: Diagnosis not present

## 2013-04-17 DIAGNOSIS — F17200 Nicotine dependence, unspecified, uncomplicated: Secondary | ICD-10-CM | POA: Diagnosis not present

## 2013-04-17 DIAGNOSIS — Z79899 Other long term (current) drug therapy: Secondary | ICD-10-CM | POA: Diagnosis not present

## 2013-04-17 DIAGNOSIS — G894 Chronic pain syndrome: Secondary | ICD-10-CM | POA: Diagnosis not present

## 2013-04-17 DIAGNOSIS — Z87891 Personal history of nicotine dependence: Secondary | ICD-10-CM | POA: Diagnosis not present

## 2013-04-25 DIAGNOSIS — R634 Abnormal weight loss: Secondary | ICD-10-CM | POA: Diagnosis not present

## 2013-04-25 DIAGNOSIS — K625 Hemorrhage of anus and rectum: Secondary | ICD-10-CM | POA: Diagnosis not present

## 2013-04-25 DIAGNOSIS — R109 Unspecified abdominal pain: Secondary | ICD-10-CM | POA: Diagnosis not present

## 2013-04-25 DIAGNOSIS — R112 Nausea with vomiting, unspecified: Secondary | ICD-10-CM | POA: Diagnosis not present

## 2013-05-01 DIAGNOSIS — R109 Unspecified abdominal pain: Secondary | ICD-10-CM | POA: Diagnosis not present

## 2013-05-01 DIAGNOSIS — I4891 Unspecified atrial fibrillation: Secondary | ICD-10-CM | POA: Diagnosis not present

## 2013-05-06 DIAGNOSIS — R634 Abnormal weight loss: Secondary | ICD-10-CM | POA: Diagnosis not present

## 2013-05-06 DIAGNOSIS — F411 Generalized anxiety disorder: Secondary | ICD-10-CM | POA: Diagnosis not present

## 2013-05-06 DIAGNOSIS — D3500 Benign neoplasm of unspecified adrenal gland: Secondary | ICD-10-CM | POA: Diagnosis not present

## 2013-05-06 DIAGNOSIS — I10 Essential (primary) hypertension: Secondary | ICD-10-CM | POA: Diagnosis not present

## 2013-05-06 DIAGNOSIS — F17200 Nicotine dependence, unspecified, uncomplicated: Secondary | ICD-10-CM | POA: Diagnosis not present

## 2013-05-06 DIAGNOSIS — Z79899 Other long term (current) drug therapy: Secondary | ICD-10-CM | POA: Diagnosis not present

## 2013-05-06 DIAGNOSIS — R51 Headache: Secondary | ICD-10-CM | POA: Diagnosis not present

## 2013-05-06 DIAGNOSIS — R918 Other nonspecific abnormal finding of lung field: Secondary | ICD-10-CM | POA: Diagnosis not present

## 2013-05-06 DIAGNOSIS — F329 Major depressive disorder, single episode, unspecified: Secondary | ICD-10-CM | POA: Diagnosis not present

## 2013-05-06 DIAGNOSIS — E785 Hyperlipidemia, unspecified: Secondary | ICD-10-CM | POA: Diagnosis not present

## 2013-05-06 DIAGNOSIS — R0789 Other chest pain: Secondary | ICD-10-CM | POA: Diagnosis not present

## 2013-05-06 DIAGNOSIS — R5381 Other malaise: Secondary | ICD-10-CM | POA: Diagnosis not present

## 2013-05-06 DIAGNOSIS — R079 Chest pain, unspecified: Secondary | ICD-10-CM | POA: Diagnosis not present

## 2013-05-06 DIAGNOSIS — G894 Chronic pain syndrome: Secondary | ICD-10-CM | POA: Diagnosis not present

## 2013-05-06 DIAGNOSIS — G43909 Migraine, unspecified, not intractable, without status migrainosus: Secondary | ICD-10-CM | POA: Diagnosis not present

## 2013-05-07 ENCOUNTER — Observation Stay: Payer: Self-pay | Admitting: Student

## 2013-05-07 DIAGNOSIS — D3500 Benign neoplasm of unspecified adrenal gland: Secondary | ICD-10-CM | POA: Diagnosis not present

## 2013-05-07 DIAGNOSIS — E785 Hyperlipidemia, unspecified: Secondary | ICD-10-CM | POA: Diagnosis not present

## 2013-05-07 DIAGNOSIS — F17200 Nicotine dependence, unspecified, uncomplicated: Secondary | ICD-10-CM | POA: Diagnosis not present

## 2013-05-07 DIAGNOSIS — R079 Chest pain, unspecified: Secondary | ICD-10-CM | POA: Diagnosis not present

## 2013-05-07 DIAGNOSIS — I10 Essential (primary) hypertension: Secondary | ICD-10-CM | POA: Diagnosis not present

## 2013-05-07 DIAGNOSIS — J189 Pneumonia, unspecified organism: Secondary | ICD-10-CM | POA: Diagnosis not present

## 2013-05-07 DIAGNOSIS — R918 Other nonspecific abnormal finding of lung field: Secondary | ICD-10-CM | POA: Diagnosis not present

## 2013-05-07 DIAGNOSIS — R51 Headache: Secondary | ICD-10-CM | POA: Diagnosis not present

## 2013-05-07 DIAGNOSIS — R0789 Other chest pain: Secondary | ICD-10-CM | POA: Diagnosis not present

## 2013-05-07 DIAGNOSIS — J984 Other disorders of lung: Secondary | ICD-10-CM | POA: Diagnosis not present

## 2013-05-07 DIAGNOSIS — R634 Abnormal weight loss: Secondary | ICD-10-CM | POA: Diagnosis not present

## 2013-05-07 LAB — CBC
HGB: 13.9 g/dL (ref 12.0–16.0)
MCH: 30.4 pg (ref 26.0–34.0)
MCHC: 32.5 g/dL (ref 32.0–36.0)
Platelet: 254 10*3/uL (ref 150–440)
RDW: 13.5 % (ref 11.5–14.5)
WBC: 8.2 10*3/uL (ref 3.6–11.0)

## 2013-05-07 LAB — DRUG SCREEN, URINE
Barbiturates, Ur Screen: NEGATIVE (ref ?–200)
Benzodiazepine, Ur Scrn: NEGATIVE (ref ?–200)
Cocaine Metabolite,Ur ~~LOC~~: NEGATIVE (ref ?–300)
MDMA (Ecstasy)Ur Screen: NEGATIVE (ref ?–500)
Opiate, Ur Screen: POSITIVE (ref ?–300)
Tricyclic, Ur Screen: POSITIVE (ref ?–1000)

## 2013-05-07 LAB — BASIC METABOLIC PANEL
BUN: 4 mg/dL — ABNORMAL LOW (ref 7–18)
Calcium, Total: 8.8 mg/dL (ref 8.5–10.1)
Chloride: 104 mmol/L (ref 98–107)
Co2: 30 mmol/L (ref 21–32)
EGFR (African American): >60
Glucose: 93 mg/dL (ref 65–99)
Osmolality: 270 (ref 275–301)

## 2013-05-07 LAB — CBC WITH DIFFERENTIAL/PLATELET
Eosinophil #: 0.5 10*3/uL (ref 0.0–0.7)
Eosinophil %: 5.5 %
Lymphocyte %: 42.1 %

## 2013-05-07 LAB — CK-MB: CK-MB: <0.5 ng/mL — ABNORMAL LOW (ref 0.5–3.6)

## 2013-05-13 DIAGNOSIS — R112 Nausea with vomiting, unspecified: Secondary | ICD-10-CM | POA: Diagnosis not present

## 2013-05-24 DIAGNOSIS — G894 Chronic pain syndrome: Secondary | ICD-10-CM | POA: Diagnosis not present

## 2013-05-24 DIAGNOSIS — F17200 Nicotine dependence, unspecified, uncomplicated: Secondary | ICD-10-CM | POA: Diagnosis not present

## 2013-05-24 DIAGNOSIS — R51 Headache: Secondary | ICD-10-CM | POA: Diagnosis not present

## 2013-05-24 DIAGNOSIS — Z87891 Personal history of nicotine dependence: Secondary | ICD-10-CM | POA: Diagnosis not present

## 2013-05-24 DIAGNOSIS — Z79899 Other long term (current) drug therapy: Secondary | ICD-10-CM | POA: Diagnosis not present

## 2013-05-28 DIAGNOSIS — R079 Chest pain, unspecified: Secondary | ICD-10-CM | POA: Diagnosis not present

## 2013-05-28 DIAGNOSIS — R0602 Shortness of breath: Secondary | ICD-10-CM | POA: Diagnosis not present

## 2013-05-28 DIAGNOSIS — F17200 Nicotine dependence, unspecified, uncomplicated: Secondary | ICD-10-CM | POA: Diagnosis not present

## 2013-05-28 DIAGNOSIS — J984 Other disorders of lung: Secondary | ICD-10-CM | POA: Diagnosis not present

## 2013-06-05 ENCOUNTER — Ambulatory Visit: Payer: Self-pay | Admitting: Internal Medicine

## 2013-06-05 DIAGNOSIS — R0602 Shortness of breath: Secondary | ICD-10-CM | POA: Diagnosis not present

## 2013-06-05 DIAGNOSIS — F17200 Nicotine dependence, unspecified, uncomplicated: Secondary | ICD-10-CM | POA: Diagnosis not present

## 2013-06-05 DIAGNOSIS — R079 Chest pain, unspecified: Secondary | ICD-10-CM | POA: Diagnosis not present

## 2013-06-05 DIAGNOSIS — F411 Generalized anxiety disorder: Secondary | ICD-10-CM | POA: Diagnosis not present

## 2013-06-05 DIAGNOSIS — Z803 Family history of malignant neoplasm of breast: Secondary | ICD-10-CM | POA: Diagnosis not present

## 2013-06-05 DIAGNOSIS — R918 Other nonspecific abnormal finding of lung field: Secondary | ICD-10-CM | POA: Diagnosis not present

## 2013-06-05 DIAGNOSIS — K219 Gastro-esophageal reflux disease without esophagitis: Secondary | ICD-10-CM | POA: Diagnosis not present

## 2013-06-05 DIAGNOSIS — Z1231 Encounter for screening mammogram for malignant neoplasm of breast: Secondary | ICD-10-CM | POA: Diagnosis not present

## 2013-06-05 DIAGNOSIS — J449 Chronic obstructive pulmonary disease, unspecified: Secondary | ICD-10-CM | POA: Diagnosis not present

## 2013-06-05 DIAGNOSIS — R42 Dizziness and giddiness: Secondary | ICD-10-CM | POA: Diagnosis not present

## 2013-06-05 DIAGNOSIS — J984 Other disorders of lung: Secondary | ICD-10-CM | POA: Diagnosis not present

## 2013-06-12 DIAGNOSIS — R079 Chest pain, unspecified: Secondary | ICD-10-CM | POA: Diagnosis not present

## 2013-06-12 DIAGNOSIS — J984 Other disorders of lung: Secondary | ICD-10-CM | POA: Diagnosis not present

## 2013-06-12 DIAGNOSIS — R0602 Shortness of breath: Secondary | ICD-10-CM | POA: Diagnosis not present

## 2013-06-12 DIAGNOSIS — IMO0001 Reserved for inherently not codable concepts without codable children: Secondary | ICD-10-CM | POA: Diagnosis not present

## 2013-06-16 HISTORY — PX: LAPAROSCOPIC CHOLECYSTECTOMY: SUR755

## 2013-06-17 DIAGNOSIS — J984 Other disorders of lung: Secondary | ICD-10-CM | POA: Diagnosis not present

## 2013-06-17 DIAGNOSIS — F17200 Nicotine dependence, unspecified, uncomplicated: Secondary | ICD-10-CM | POA: Diagnosis not present

## 2013-06-17 DIAGNOSIS — K219 Gastro-esophageal reflux disease without esophagitis: Secondary | ICD-10-CM | POA: Diagnosis not present

## 2013-06-19 ENCOUNTER — Ambulatory Visit: Payer: Self-pay | Admitting: Internal Medicine

## 2013-06-19 DIAGNOSIS — Z885 Allergy status to narcotic agent status: Secondary | ICD-10-CM | POA: Diagnosis not present

## 2013-06-19 DIAGNOSIS — Z888 Allergy status to other drugs, medicaments and biological substances status: Secondary | ICD-10-CM | POA: Diagnosis not present

## 2013-06-19 DIAGNOSIS — R9389 Abnormal findings on diagnostic imaging of other specified body structures: Secondary | ICD-10-CM | POA: Diagnosis not present

## 2013-06-19 DIAGNOSIS — Z881 Allergy status to other antibiotic agents status: Secondary | ICD-10-CM | POA: Diagnosis not present

## 2013-06-19 DIAGNOSIS — R599 Enlarged lymph nodes, unspecified: Secondary | ICD-10-CM | POA: Diagnosis not present

## 2013-06-19 DIAGNOSIS — I10 Essential (primary) hypertension: Secondary | ICD-10-CM | POA: Diagnosis not present

## 2013-06-19 DIAGNOSIS — Z79899 Other long term (current) drug therapy: Secondary | ICD-10-CM | POA: Diagnosis not present

## 2013-06-19 DIAGNOSIS — R059 Cough, unspecified: Secondary | ICD-10-CM | POA: Diagnosis not present

## 2013-06-19 DIAGNOSIS — R918 Other nonspecific abnormal finding of lung field: Secondary | ICD-10-CM | POA: Diagnosis not present

## 2013-06-19 DIAGNOSIS — K219 Gastro-esophageal reflux disease without esophagitis: Secondary | ICD-10-CM | POA: Diagnosis not present

## 2013-06-19 DIAGNOSIS — F3289 Other specified depressive episodes: Secondary | ICD-10-CM | POA: Diagnosis not present

## 2013-06-19 DIAGNOSIS — E785 Hyperlipidemia, unspecified: Secondary | ICD-10-CM | POA: Diagnosis not present

## 2013-06-19 DIAGNOSIS — F17200 Nicotine dependence, unspecified, uncomplicated: Secondary | ICD-10-CM | POA: Diagnosis not present

## 2013-06-19 DIAGNOSIS — R05 Cough: Secondary | ICD-10-CM | POA: Diagnosis not present

## 2013-06-20 DIAGNOSIS — R634 Abnormal weight loss: Secondary | ICD-10-CM | POA: Diagnosis not present

## 2013-06-21 DIAGNOSIS — R112 Nausea with vomiting, unspecified: Secondary | ICD-10-CM | POA: Diagnosis not present

## 2013-06-21 DIAGNOSIS — R634 Abnormal weight loss: Secondary | ICD-10-CM | POA: Diagnosis not present

## 2013-06-24 LAB — BRONCHIAL WASH CULTURE

## 2013-06-27 DIAGNOSIS — N390 Urinary tract infection, site not specified: Secondary | ICD-10-CM | POA: Diagnosis not present

## 2013-06-27 DIAGNOSIS — F17200 Nicotine dependence, unspecified, uncomplicated: Secondary | ICD-10-CM | POA: Diagnosis not present

## 2013-06-27 DIAGNOSIS — R300 Dysuria: Secondary | ICD-10-CM | POA: Diagnosis not present

## 2013-06-27 DIAGNOSIS — J1529 Pneumonia due to other staphylococcus: Secondary | ICD-10-CM | POA: Diagnosis not present

## 2013-06-27 DIAGNOSIS — J984 Other disorders of lung: Secondary | ICD-10-CM | POA: Diagnosis not present

## 2013-06-27 DIAGNOSIS — N329 Bladder disorder, unspecified: Secondary | ICD-10-CM | POA: Diagnosis not present

## 2013-07-10 ENCOUNTER — Ambulatory Visit: Payer: Self-pay | Admitting: Gastroenterology

## 2013-07-10 DIAGNOSIS — R634 Abnormal weight loss: Secondary | ICD-10-CM | POA: Diagnosis not present

## 2013-07-10 DIAGNOSIS — K228 Other specified diseases of esophagus: Secondary | ICD-10-CM | POA: Diagnosis not present

## 2013-07-10 DIAGNOSIS — K2970 Gastritis, unspecified, without bleeding: Secondary | ICD-10-CM | POA: Diagnosis not present

## 2013-07-10 DIAGNOSIS — K319 Disease of stomach and duodenum, unspecified: Secondary | ICD-10-CM | POA: Diagnosis not present

## 2013-07-10 DIAGNOSIS — Z888 Allergy status to other drugs, medicaments and biological substances status: Secondary | ICD-10-CM | POA: Diagnosis not present

## 2013-07-10 DIAGNOSIS — R112 Nausea with vomiting, unspecified: Secondary | ICD-10-CM | POA: Diagnosis not present

## 2013-07-10 DIAGNOSIS — E785 Hyperlipidemia, unspecified: Secondary | ICD-10-CM | POA: Diagnosis not present

## 2013-07-10 DIAGNOSIS — D126 Benign neoplasm of colon, unspecified: Secondary | ICD-10-CM | POA: Diagnosis not present

## 2013-07-10 DIAGNOSIS — I10 Essential (primary) hypertension: Secondary | ICD-10-CM | POA: Diagnosis not present

## 2013-07-10 DIAGNOSIS — F17200 Nicotine dependence, unspecified, uncomplicated: Secondary | ICD-10-CM | POA: Diagnosis not present

## 2013-07-10 DIAGNOSIS — Z886 Allergy status to analgesic agent status: Secondary | ICD-10-CM | POA: Diagnosis not present

## 2013-07-10 DIAGNOSIS — K2289 Other specified disease of esophagus: Secondary | ICD-10-CM | POA: Diagnosis not present

## 2013-07-10 DIAGNOSIS — K648 Other hemorrhoids: Secondary | ICD-10-CM | POA: Diagnosis not present

## 2013-07-10 DIAGNOSIS — K2990 Gastroduodenitis, unspecified, without bleeding: Secondary | ICD-10-CM | POA: Diagnosis not present

## 2013-07-10 LAB — CULTURE, FUNGUS WITHOUT SMEAR

## 2013-07-15 LAB — PATHOLOGY REPORT

## 2013-07-16 DIAGNOSIS — J984 Other disorders of lung: Secondary | ICD-10-CM | POA: Diagnosis not present

## 2013-07-16 DIAGNOSIS — R0602 Shortness of breath: Secondary | ICD-10-CM | POA: Diagnosis not present

## 2013-07-16 DIAGNOSIS — F17200 Nicotine dependence, unspecified, uncomplicated: Secondary | ICD-10-CM | POA: Diagnosis not present

## 2013-07-16 DIAGNOSIS — R5383 Other fatigue: Secondary | ICD-10-CM | POA: Diagnosis not present

## 2013-07-16 DIAGNOSIS — R5381 Other malaise: Secondary | ICD-10-CM | POA: Diagnosis not present

## 2013-07-26 DIAGNOSIS — R112 Nausea with vomiting, unspecified: Secondary | ICD-10-CM | POA: Diagnosis not present

## 2013-07-26 DIAGNOSIS — R634 Abnormal weight loss: Secondary | ICD-10-CM | POA: Diagnosis not present

## 2013-08-01 DIAGNOSIS — F17200 Nicotine dependence, unspecified, uncomplicated: Secondary | ICD-10-CM | POA: Diagnosis not present

## 2013-08-01 DIAGNOSIS — Z87891 Personal history of nicotine dependence: Secondary | ICD-10-CM | POA: Diagnosis not present

## 2013-08-01 DIAGNOSIS — G894 Chronic pain syndrome: Secondary | ICD-10-CM | POA: Diagnosis not present

## 2013-08-01 DIAGNOSIS — R51 Headache: Secondary | ICD-10-CM | POA: Diagnosis not present

## 2013-08-01 DIAGNOSIS — Z79899 Other long term (current) drug therapy: Secondary | ICD-10-CM | POA: Diagnosis not present

## 2013-08-07 ENCOUNTER — Ambulatory Visit: Payer: Self-pay | Admitting: Gastroenterology

## 2013-08-07 DIAGNOSIS — R112 Nausea with vomiting, unspecified: Secondary | ICD-10-CM | POA: Diagnosis not present

## 2013-08-12 DIAGNOSIS — I959 Hypotension, unspecified: Secondary | ICD-10-CM | POA: Diagnosis not present

## 2013-08-12 DIAGNOSIS — F3289 Other specified depressive episodes: Secondary | ICD-10-CM | POA: Diagnosis not present

## 2013-08-12 DIAGNOSIS — F329 Major depressive disorder, single episode, unspecified: Secondary | ICD-10-CM | POA: Diagnosis not present

## 2013-08-12 DIAGNOSIS — R634 Abnormal weight loss: Secondary | ICD-10-CM | POA: Diagnosis not present

## 2013-08-12 DIAGNOSIS — R5383 Other fatigue: Secondary | ICD-10-CM | POA: Diagnosis not present

## 2013-08-12 DIAGNOSIS — F411 Generalized anxiety disorder: Secondary | ICD-10-CM | POA: Diagnosis not present

## 2013-08-12 DIAGNOSIS — R5381 Other malaise: Secondary | ICD-10-CM | POA: Diagnosis not present

## 2013-08-22 ENCOUNTER — Other Ambulatory Visit (HOSPITAL_COMMUNITY): Payer: Self-pay | Admitting: Dentistry

## 2013-09-05 DIAGNOSIS — J069 Acute upper respiratory infection, unspecified: Secondary | ICD-10-CM | POA: Diagnosis not present

## 2013-09-05 DIAGNOSIS — R5381 Other malaise: Secondary | ICD-10-CM | POA: Diagnosis not present

## 2013-09-05 DIAGNOSIS — F411 Generalized anxiety disorder: Secondary | ICD-10-CM | POA: Diagnosis not present

## 2013-09-05 DIAGNOSIS — R634 Abnormal weight loss: Secondary | ICD-10-CM | POA: Diagnosis not present

## 2013-09-05 DIAGNOSIS — R5383 Other fatigue: Secondary | ICD-10-CM | POA: Diagnosis not present

## 2013-09-09 ENCOUNTER — Other Ambulatory Visit (HOSPITAL_COMMUNITY): Payer: Self-pay | Admitting: Dentistry

## 2013-10-03 ENCOUNTER — Ambulatory Visit (HOSPITAL_COMMUNITY): Payer: Medicaid - Dental | Admitting: Dentistry

## 2013-10-03 ENCOUNTER — Encounter (HOSPITAL_COMMUNITY): Payer: Self-pay | Admitting: Dentistry

## 2013-10-03 ENCOUNTER — Encounter (INDEPENDENT_AMBULATORY_CARE_PROVIDER_SITE_OTHER): Payer: Self-pay

## 2013-10-03 VITALS — BP 122/81 | HR 64 | Temp 97.9°F | Ht 66.0 in | Wt 133.0 lb

## 2013-10-03 DIAGNOSIS — K089 Disorder of teeth and supporting structures, unspecified: Secondary | ICD-10-CM

## 2013-10-03 DIAGNOSIS — K08109 Complete loss of teeth, unspecified cause, unspecified class: Secondary | ICD-10-CM

## 2013-10-03 DIAGNOSIS — K0889 Other specified disorders of teeth and supporting structures: Secondary | ICD-10-CM

## 2013-10-03 DIAGNOSIS — IMO0002 Reserved for concepts with insufficient information to code with codable children: Secondary | ICD-10-CM

## 2013-10-03 DIAGNOSIS — K117 Disturbances of salivary secretion: Secondary | ICD-10-CM

## 2013-10-03 DIAGNOSIS — K0401 Reversible pulpitis: Secondary | ICD-10-CM

## 2013-10-03 DIAGNOSIS — K029 Dental caries, unspecified: Secondary | ICD-10-CM

## 2013-10-03 DIAGNOSIS — K0530 Chronic periodontitis, unspecified: Secondary | ICD-10-CM

## 2013-10-03 DIAGNOSIS — K045 Chronic apical periodontitis: Secondary | ICD-10-CM

## 2013-10-03 DIAGNOSIS — K083 Retained dental root: Secondary | ICD-10-CM

## 2013-10-03 DIAGNOSIS — K036 Deposits [accretions] on teeth: Secondary | ICD-10-CM

## 2013-10-03 DIAGNOSIS — M264 Malocclusion, unspecified: Secondary | ICD-10-CM

## 2013-10-03 DIAGNOSIS — R682 Dry mouth, unspecified: Secondary | ICD-10-CM

## 2013-10-03 DIAGNOSIS — M270 Developmental disorders of jaws: Secondary | ICD-10-CM

## 2013-10-03 NOTE — Patient Instructions (Addendum)
The patient is to followup with her primary physician, Dr. Humphrey Rolls, next week to obtain surgical clearance to proceed with extraction of remaining teeth with alveoloplasty and pre-prosthetic surgery as indicated in the operating room with general anesthesia. The patient will then followup with the dentist of her choice for fabrication of upper and lower complete dentures after adequate healing. Medicaid prior approval will need be obtained prior to fabrication of upper and lower complete dentures.  Dr. Enrique Sack

## 2013-10-03 NOTE — Progress Notes (Signed)
DENTAL CONSULTATION  Date of Consultation:  10/03/2013 Patient Name:   Glenda [name] Date of Birth:   03-05-1956 Medical Record Number: [id number]  VITALS: BP 122/81  Pulse 64  Temp(Src) 97.9 F (36.6 C) (Oral)   CHIEF COMPLAINT: Patient was referred for evaluation of tooth pain and poor dentition.  HPI: Glenda [name] is a 58 year old female with multiple medical problems who presents for evaluation of acute toothache symptoms and poor dentition. Patient was referred courtesy of Dr. Devona Konig for evaluation and treatment as indicated. Patient is now seen for a comprehensive dental examination and to rule out dental infection that may affect the patient's systemic health.  Patient is currently complaining of a history of toothache symptoms involving the upper right, lower left, and lower right quadrants. Patient is experiencing pain today from the lower left premolar. Patient describes the pain as being sharp, intermittent in nature, and spontaneous at times. Pain last for hours at a time. Currently the pain is 7/10 today but it has reached an intensity of 10 out of 10. The patient last saw a dentist in October 2014 to have a molar extracted. Patient had this done by Dr. Phillis Haggis in [place], [place]. Patient denies having any dental complications from that dental extraction. The patient is now interested in having all remaining teeth extracted with subsequent fabrication of upper and lower complete dentures after adequate healing.   PMH: Past Medical History  Diagnosis Date  . Chronic headaches     Migraines  . Anxiety state, unspecified   . Depressive disorder, not elsewhere classified   . Esophageal reflux   . Dizziness and giddiness   . Cervicalgia   . Abdominal pain, unspecified site   . Unspecified essential hypertension   . Other and unspecified hyperlipidemia   . Chronic airway obstruction, not elsewhere classified   . Gastroparesis   . Osteoporosis      PSH: Past Surgical History  Procedure Laterality Date  . Total abdominal hysterectomy  1995    [facility]  . Laparoscopic cholecystectomy  06/2013    [facility]  . Chiari malformation surgery  2009    [place]    ALLERGIES: Allergies  Allergen Reactions  . Doxycycline Itching  . Fentanyl Other (See Comments)    "made me hyper"  . Morphine And Related Other (See Comments)    "chest pain"    MEDICATIONS: Current Outpatient Prescriptions  Medication Sig Dispense Refill  . clonazePAM (KLONOPIN) 1 MG tablet Take 1 mg by mouth 2 (two) times daily as needed for anxiety.      . cyclobenzaprine (FLEXERIL) 10 MG tablet Take 10 mg by mouth 3 (three) times daily as needed for muscle spasms.      Marland Kitchen dexlansoprazole (DEXILANT) 60 MG capsule Take 60 mg by mouth daily.      Marland Kitchen estrogens, conjugated, (PREMARIN) 0.3 MG tablet Take 0.3 mg by mouth daily. Take daily for 21 days then do not take for 7 days.      . meclizine (ANTIVERT) 25 MG tablet Take 25 mg by mouth 3 (three) times daily as needed for dizziness.      . ondansetron (ZOFRAN) 4 MG/5ML solution Take by mouth every 8 (eight) hours as needed for nausea or vomiting.      . OxyCODONE (OXYCONTIN) 20 mg T12A 12 hr tablet Take 20 mg by mouth every 12 (twelve) hours.      Marland Kitchen oxyCODONE-acetaminophen (PERCOCET) 10-325 MG per tablet Take 1 tablet by mouth every  6 (six) hours as needed for pain.      Marland Kitchen PARoxetine (PAXIL) 40 MG tablet Take 40 mg by mouth every morning.       No current facility-administered medications for this visit.    LABS: Lab Results  Component Value Date   WBC 13.0* 06/06/2008   HGB 15.1* 06/06/2008   HCT 44.2 06/06/2008   MCV 95.3 06/06/2008   PLT 266 06/06/2008      Component Value Date/Time   NA 139 06/06/2008 1528   K 4.1 06/06/2008 1528   CL 101 06/06/2008 1528   CO2 29 06/06/2008 1528   GLUCOSE 101* 06/06/2008 1528   BUN 14 06/06/2008 1528   CREATININE 0.9 06/06/2008 1528   CALCIUM 9.4 06/06/2008 1528   GFRNONAA 70 06/06/2008  1528   GFRAA 85 06/06/2008 1528   Lab Results  Component Value Date   INR 1.0 RATIO 06/06/2008   No results found for this basename: PTT    SOCIAL HISTORY: History   Social History  . Marital Status: Married    Spouse Name: N/A    Number of Children: 2  . Years of Education: N/A   Occupational History  .      Disabled   Social History Main Topics  . Smoking status: Current Every Day Smoker -- 0.50 packs/day for 40 years    Types: Cigarettes  . Smokeless tobacco: Never Used  . Alcohol Use: Not on file     Comment: Denies  . Drug Use: No  . Sexual Activity: Not on file   Other Topics Concern  . Not on file   Social History Narrative  . No narrative on file    FAMILY HISTORY: Family History  Problem Relation Age of Onset  . Diabetes Mother   . Cancer Mother   . GER disease Mother   . GER disease Father   . Osteoarthritis Mother   . Stroke Sister      REVIEW OF SYSTEMS: Reviewed with patient and included in dental record.   DENTAL HISTORY: CHIEF COMPLAINT: Patient was referred for evaluation of tooth pain and poor dentition.  HPI: Glenda [name] is a 58 year old female with multiple medical problems who presents for evaluation of acute toothache symptoms and poor dentition. Patient was referred courtesy of Dr. Devona Konig for evaluation and treatment as indicated. Patient is now seen for a comprehensive dental examination and to rule out dental infection that may affect the patient's systemic health.  Patient is currently complaining of a history of toothache symptoms involving the upper right, lower left, and lower right quadrants. Patient is experiencing pain today from the lower left premolar. Patient describes the pain as being sharp, intermittent in nature, and spontaneous at times. Pain last for hours at a time. Currently the pain is 7/10 today but it has reached an intensity of 10 out of 10. The patient last saw a dentist in October 2014 to have a molar  extracted. Patient had this done by Dr. Phillis Haggis in [place], [place]. Patient denies having any dental complications from that dental extraction. The patient is now interested in having all remaining teeth extracted with subsequent fabrication of upper and lower complete dentures after adequate healing.   DENTAL EXAMINATION: GENERAL: Patient is a well-developed, slightly built female in no acute distress. HEAD AND NECK: There is no palpable submandibular lymphadenopathy. The patient denies acute TMJ symptoms but does have right TMJ crepitus. 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 I do not see any evidence of abscess formation. DENTITION: The patient is missing tooth numbers 1, 3, 4, 5, 12, 13, 14, 15, 16, 17, 18, 19, 20, 30, and 31. Tooth numbers 27, 28, and 29 are present as retained root segments. PERIODONTAL: The patient has chronic periodontitis with plaque and calculus accumulations, generalized gingival recession, and tooth mobility as per dental charting form. There is moderate bone loss noted. DENTAL CARIES/SUBOPTIMAL RESTORATIONS: There are multiple dental caries noted as per dental charting form. ENDODONTIC: The patient is complaining of a history of acute pulpitis symptoms following upper right, lower left, and lower right quadrants. Patient currently points to tooth #21 as the offending tooth with acute, sharp pain that reaches an intensity of 10 out 10 but is currently 7/10 in intensity. There is a history of spontaneous pain. There is incipient pedicle pathology associated with tooth numbers 7 and 29. CROWN AND BRIDGE: There are no crown or bridge restorations. PROSTHODONTIC: Patient has a history of upper partial denture that she no longer wears. Patient denies ever having had a lower partial denture. OCCLUSION: Patient has a poor occlusal scheme segmental multiple missing teeth, multiple retained root segments,  supra-eruption and drifting of the unopposed teeth into the edentulous areas, multiple malposition teeth, and lack of replacement of the missing teeth with dental prostheses.  RADIOGRAPHIC INTERPRETATION: An orthopantogram was taken and supplemented with 9 periapical radiographs. There are multiple missing teeth. There are multiple retained root segments. Multiple dental caries are noted. There are periapical radiolucencies associated with tooth numbers 7 and 29. There is moderate bone loss noted. There is supra-eruption and drifting of the unopposed teeth into the edentulous areas   ASSESSMENTS: 1. Acute pulpitis symptoms 2. Chronic apical periodontitis 3. Chronic periodontitis with bone loss 4. Gingival recession 5. Plaque and calculus accumulations 6. Multiple dental caries 7. Tooth mobility 8. Bilateral mandibular lingual tori 9. History of ill fitting maxillary partial denture 10. Multiple missing teeth 11. Supra-eruption and drifting of the unopposed teeth into the edentulous areas 12. Poor occlusal scheme 13. Xerostomia 14. Complex medical history  PLAN/RECOMMENDATIONS: 1. I discussed the risks, benefits, and complications of various treatment options with the patient in relationship to her medical and dental conditions. We discussed various treatment options to include no treatment, multiple extractions with alveoloplasty, pre-prosthetic surgery as indicated, periodontal therapy, dental restorations, root canal therapy, crown and bridge therapy, implant therapy, and replacement of missing teeth as indicated. The patient currently wishes to proceed with extraction of all remaining teeth with alveoloplasty and bilateral mandibular lingual tori reductions in the operating room with general anesthesia. Patient will then followup with a dentist of her choice for fabrication of upper and lower complete dentures after adequate healing.  Prior to scheduling the operating procedure, the  patient will need to see Dr. Devona Konig for a medical exam (history and physical) and surgical clearance for the dental procedures in the operating room with general anesthesia.   2. Discussion of findings with medical team and coordination of future medical and dental care as needed.  I spent 75 minutes face to face with patient and more than 50% of time was spent in counseling and /or coordination of care.   Lenn Cal, DDS

## 2013-10-08 DIAGNOSIS — R079 Chest pain, unspecified: Secondary | ICD-10-CM | POA: Diagnosis not present

## 2013-10-08 DIAGNOSIS — E782 Mixed hyperlipidemia: Secondary | ICD-10-CM | POA: Diagnosis not present

## 2013-10-08 DIAGNOSIS — R0602 Shortness of breath: Secondary | ICD-10-CM | POA: Diagnosis not present

## 2013-10-08 DIAGNOSIS — F17200 Nicotine dependence, unspecified, uncomplicated: Secondary | ICD-10-CM | POA: Diagnosis not present

## 2013-10-09 DIAGNOSIS — F17200 Nicotine dependence, unspecified, uncomplicated: Secondary | ICD-10-CM | POA: Diagnosis not present

## 2013-10-09 DIAGNOSIS — R51 Headache: Secondary | ICD-10-CM | POA: Diagnosis not present

## 2013-10-09 DIAGNOSIS — Z79899 Other long term (current) drug therapy: Secondary | ICD-10-CM | POA: Diagnosis not present

## 2013-10-09 DIAGNOSIS — Z87891 Personal history of nicotine dependence: Secondary | ICD-10-CM | POA: Diagnosis not present

## 2013-10-09 DIAGNOSIS — G894 Chronic pain syndrome: Secondary | ICD-10-CM | POA: Diagnosis not present

## 2013-10-23 DIAGNOSIS — R0602 Shortness of breath: Secondary | ICD-10-CM | POA: Diagnosis not present

## 2013-10-28 DIAGNOSIS — I059 Rheumatic mitral valve disease, unspecified: Secondary | ICD-10-CM | POA: Diagnosis not present

## 2013-11-11 DIAGNOSIS — F17200 Nicotine dependence, unspecified, uncomplicated: Secondary | ICD-10-CM | POA: Diagnosis not present

## 2013-11-11 DIAGNOSIS — R0602 Shortness of breath: Secondary | ICD-10-CM | POA: Diagnosis not present

## 2013-11-12 ENCOUNTER — Other Ambulatory Visit (HOSPITAL_COMMUNITY): Payer: Self-pay | Admitting: Dentistry

## 2013-11-13 ENCOUNTER — Encounter (HOSPITAL_COMMUNITY): Payer: Self-pay | Admitting: Pharmacy Technician

## 2013-11-19 ENCOUNTER — Ambulatory Visit (HOSPITAL_COMMUNITY)
Admission: RE | Admit: 2013-11-19 | Discharge: 2013-11-19 | Disposition: A | Discharge status: Home / Self Care | Payer: Medicare Other | Source: Ambulatory Visit | Attending: Anesthesiology | Admitting: Anesthesiology

## 2013-11-19 ENCOUNTER — Encounter (HOSPITAL_COMMUNITY)
Admission: RE | Admit: 2013-11-19 | Discharge: 2013-11-19 | Disposition: A | Discharge status: Home / Self Care | Payer: Medicare Other | Source: Ambulatory Visit | Attending: Dentistry | Admitting: Dentistry

## 2013-11-19 ENCOUNTER — Encounter (HOSPITAL_COMMUNITY): Payer: Self-pay

## 2013-11-19 DIAGNOSIS — I10 Essential (primary) hypertension: Secondary | ICD-10-CM | POA: Insufficient documentation

## 2013-11-19 DIAGNOSIS — Z01810 Encounter for preprocedural cardiovascular examination: Secondary | ICD-10-CM | POA: Insufficient documentation

## 2013-11-19 DIAGNOSIS — Z01818 Encounter for other preprocedural examination: Secondary | ICD-10-CM | POA: Insufficient documentation

## 2013-11-19 DIAGNOSIS — R918 Other nonspecific abnormal finding of lung field: Secondary | ICD-10-CM | POA: Insufficient documentation

## 2013-11-19 DIAGNOSIS — Z01812 Encounter for preprocedural laboratory examination: Secondary | ICD-10-CM | POA: Insufficient documentation

## 2013-11-19 HISTORY — DX: Dizziness and giddiness: R42

## 2013-11-19 HISTORY — DX: Chronic periodontitis, unspecified: K05.30

## 2013-11-19 HISTORY — DX: Pneumonia, unspecified organism: J18.9

## 2013-11-19 HISTORY — DX: Sleep disorder, unspecified: G47.9

## 2013-11-19 HISTORY — DX: Nausea with vomiting, unspecified: R11.2

## 2013-11-19 HISTORY — DX: Other specified postprocedural states: Z98.890

## 2013-11-19 HISTORY — DX: Effusion, unspecified ankle: M25.473

## 2013-11-19 LAB — CBC
HCT: 40 % (ref 36.0–46.0)
HEMOGLOBIN: 13.3 g/dL (ref 12.0–15.0)
MCH: 30.8 pg (ref 26.0–34.0)
MCHC: 33.3 g/dL (ref 30.0–36.0)
MCV: 92.6 fL (ref 78.0–100.0)
Platelets: 240 10*3/uL (ref 150–400)
RBC: 4.32 MIL/uL (ref 3.87–5.11)
RDW: 12.5 % (ref 11.5–15.5)
WBC: 6.8 10*3/uL (ref 4.0–10.5)

## 2013-11-19 LAB — BASIC METABOLIC PANEL
Anion gap: 10 (ref 5–15)
BUN: 4 mg/dL — ABNORMAL LOW (ref 6–23)
CHLORIDE: 101 meq/L (ref 96–112)
CO2: 29 mEq/L (ref 19–32)
Calcium: 9.6 mg/dL (ref 8.4–10.5)
Creatinine, Ser: 0.72 mg/dL (ref 0.50–1.10)
Glucose, Bld: 74 mg/dL (ref 70–99)
POTASSIUM: 3.8 meq/L (ref 3.7–5.3)
SODIUM: 140 meq/L (ref 137–147)

## 2013-11-19 NOTE — Patient Instructions (Addendum)
AREEBAH MEINDERS  11/19/2013                           YOUR PROCEDURE IS SCHEDULED ON:  11/21/13 AT 7:30 AM               ENTER [facility] ENTRANCE AND                            FOLLOW  SIGNS TO SHORT STAY CENTER                 ARRIVE AT SHORT STAY AT: 5:30 AM               CALL THIS NUMBER IF ANY PROBLEMS THE DAY OF SURGERY :               [phone]                                REMEMBER:   Do not eat food or drink liquids AFTER MIDNIGHT                 Take these medicines the morning of surgery with               A SIPS OF WATER : clonazepam if needed / dexilant / premerine - meclizine / primatine meclizine / oxycodone         Do not wear jewelry, make-up   Do not wear lotions, powders, or perfumes.   Do not shave legs or underarms 12 hrs. before surgery (men may shave face)  Do not bring valuables to the hospital.  Contacts, dentures or bridgework may not be worn into surgery.  Leave suitcase in the car. After surgery it may be brought to your room.  For patients admitted to the hospital more than one night, checkout time is            11:00 AM                                                       The day of discharge.   Patients discharged the day of surgery will not be allowed to drive home.            If going home same day of surgery, must have someone stay with you              FIRST 24 hrs at home and arrange for some one to drive you              home from hospital.   ________________________________________________________________________                                                                        [place]  Before surgery, you can play an important role.  Because skin is not sterile, your skin needs to be as free  of germs as possible.  You can reduce the number of germs on your skin by washing with CHG (chlorahexidine gluconate) soap before surgery.  CHG is an antiseptic cleaner which kills  germs and bonds with the skin to continue killing germs even after washing. Please DO NOT use if you have an allergy to CHG or antibacterial soaps.  If your skin becomes reddened/irritated stop using the CHG and inform your nurse when you arrive at Short Stay. Do not shave (including legs and underarms) for at least 48 hours prior to the first CHG shower.  You may shave your face. Please follow these instructions carefully:   1.  Shower with CHG Soap the night before surgery and the  morning of Surgery.   2.  If you choose to wash your hair, wash your hair first as usual with your  normal  Shampoo.   3.  After you shampoo, rinse your hair and body thoroughly to remove the  shampoo.                                         4.  Use CHG as you would any other liquid soap.  You can apply chg directly  to the skin and wash . Gently wash with scrungie or clean wascloth    5.  Apply the CHG Soap to your body ONLY FROM THE NECK DOWN.   Do not use on open                           Wound or open sores. Avoid contact with eyes, ears mouth and genitals (private parts).                        Genitals (private parts) with your normal soap.              6.  Wash thoroughly, paying special attention to the area where your surgery  will be performed.   7.  Thoroughly rinse your body with warm water from the neck down.   8.  DO NOT shower/wash with your normal soap after using and rinsing off  the CHG Soap .                9.  Pat yourself dry with a clean towel.             10.  Wear clean pajamas.             11.  Place clean sheets on your bed the night of your first shower and do not  sleep with pets.  Day of Surgery : Do not apply any lotions/deodorants the morning of surgery.  Please wear clean clothes to the hospital/surgery center.  FAILURE TO FOLLOW THESE INSTRUCTIONS MAY RESULT IN THE CANCELLATION OF YOUR SURGERY    PATIENT  SIGNATURE_________________________________  ______________________________________________________________________

## 2013-11-21 ENCOUNTER — Encounter (HOSPITAL_COMMUNITY): Payer: Medicare Other | Admitting: Anesthesiology

## 2013-11-21 ENCOUNTER — Encounter (HOSPITAL_COMMUNITY)
Admission: RE | Disposition: A | Discharge status: Home / Self Care | Payer: Self-pay | Source: Ambulatory Visit | Attending: Dentistry

## 2013-11-21 ENCOUNTER — Encounter (HOSPITAL_COMMUNITY): Payer: Self-pay | Admitting: *Deleted

## 2013-11-21 ENCOUNTER — Ambulatory Visit (HOSPITAL_COMMUNITY): Payer: Medicare Other | Admitting: Anesthesiology

## 2013-11-21 ENCOUNTER — Ambulatory Visit (HOSPITAL_COMMUNITY)
Admission: RE | Admit: 2013-11-21 | Discharge: 2013-11-21 | Disposition: A | Discharge status: Home / Self Care | Payer: Medicare Other | Source: Ambulatory Visit | Attending: Dentistry | Admitting: Dentistry

## 2013-11-21 DIAGNOSIS — K0401 Reversible pulpitis: Secondary | ICD-10-CM | POA: Diagnosis not present

## 2013-11-21 DIAGNOSIS — I10 Essential (primary) hypertension: Secondary | ICD-10-CM | POA: Diagnosis not present

## 2013-11-21 DIAGNOSIS — M278 Other specified diseases of jaws: Secondary | ICD-10-CM | POA: Insufficient documentation

## 2013-11-21 DIAGNOSIS — K029 Dental caries, unspecified: Secondary | ICD-10-CM | POA: Insufficient documentation

## 2013-11-21 DIAGNOSIS — K140 Glossitis: Secondary | ICD-10-CM | POA: Diagnosis not present

## 2013-11-21 DIAGNOSIS — K0530 Chronic periodontitis, unspecified: Secondary | ICD-10-CM | POA: Diagnosis present

## 2013-11-21 DIAGNOSIS — K045 Chronic apical periodontitis: Secondary | ICD-10-CM | POA: Diagnosis not present

## 2013-11-21 DIAGNOSIS — K083 Retained dental root: Secondary | ICD-10-CM | POA: Diagnosis present

## 2013-11-21 DIAGNOSIS — K0500 Acute gingivitis, plaque induced: Secondary | ICD-10-CM | POA: Diagnosis not present

## 2013-11-21 DIAGNOSIS — M270 Developmental disorders of jaws: Secondary | ICD-10-CM | POA: Diagnosis present

## 2013-11-21 HISTORY — PX: MULTIPLE EXTRACTIONS WITH ALVEOLOPLASTY: SHX5342

## 2013-11-21 SURGERY — MULTIPLE EXTRACTION WITH ALVEOLOPLASTY
Anesthesia: General | Site: Mouth

## 2013-11-21 MED ORDER — ISOPROPYL ALCOHOL 70 % SOLN
Status: DC | PRN
  Administered 2013-11-21: 1 via TOPICAL

## 2013-11-21 MED ORDER — CEFAZOLIN SODIUM-DEXTROSE 2-3 GM-% IV SOLR
2.0000 g | Freq: Once | INTRAVENOUS | Status: AC
  Administered 2013-11-21: 2 g via INTRAVENOUS

## 2013-11-21 MED ORDER — FENTANYL CITRATE 0.05 MG/ML IJ SOLN
INTRAMUSCULAR | Status: AC
  Filled 2013-11-21: qty 5

## 2013-11-21 MED ORDER — MIDAZOLAM HCL 5 MG/5ML IJ SOLN
INTRAMUSCULAR | Status: DC | PRN
  Administered 2013-11-21: 2 mg via INTRAVENOUS

## 2013-11-21 MED ORDER — NEOSTIGMINE METHYLSULFATE 10 MG/10ML IV SOLN
INTRAVENOUS | Status: DC | PRN
  Administered 2013-11-21: 4 mg via INTRAVENOUS

## 2013-11-21 MED ORDER — KETOROLAC TROMETHAMINE 30 MG/ML IJ SOLN
INTRAMUSCULAR | Status: AC
  Filled 2013-11-21: qty 1

## 2013-11-21 MED ORDER — ONDANSETRON HCL 4 MG/2ML IJ SOLN
INTRAMUSCULAR | Status: AC
  Filled 2013-11-21: qty 2

## 2013-11-21 MED ORDER — DEXAMETHASONE SODIUM PHOSPHATE 10 MG/ML IJ SOLN
INTRAMUSCULAR | Status: DC | PRN
  Administered 2013-11-21: 10 mg via INTRAVENOUS

## 2013-11-21 MED ORDER — CEFAZOLIN SODIUM-DEXTROSE 2-3 GM-% IV SOLR
INTRAVENOUS | Status: AC
  Filled 2013-11-21: qty 50

## 2013-11-21 MED ORDER — MIDAZOLAM HCL 2 MG/2ML IJ SOLN
INTRAMUSCULAR | Status: AC
  Filled 2013-11-21: qty 2

## 2013-11-21 MED ORDER — GLYCOPYRROLATE 0.2 MG/ML IJ SOLN
INTRAMUSCULAR | Status: DC | PRN
  Administered 2013-11-21: 0.6 mg via INTRAVENOUS

## 2013-11-21 MED ORDER — DEXAMETHASONE SODIUM PHOSPHATE 10 MG/ML IJ SOLN
INTRAMUSCULAR | Status: AC
  Filled 2013-11-21: qty 1

## 2013-11-21 MED ORDER — KETOROLAC TROMETHAMINE 30 MG/ML IJ SOLN
INTRAMUSCULAR | Status: DC | PRN
  Administered 2013-11-21: 30 mg via INTRAVENOUS

## 2013-11-21 MED ORDER — ONDANSETRON HCL 4 MG/2ML IJ SOLN
INTRAMUSCULAR | Status: DC | PRN
  Administered 2013-11-21: 4 mg via INTRAVENOUS

## 2013-11-21 MED ORDER — ROCURONIUM BROMIDE 100 MG/10ML IV SOLN
INTRAVENOUS | Status: DC | PRN
  Administered 2013-11-21: 40 mg via INTRAVENOUS

## 2013-11-21 MED ORDER — ISOPROPYL ALCOHOL 70 % SOLN
Status: AC
  Filled 2013-11-21: qty 480

## 2013-11-21 MED ORDER — LIDOCAINE-EPINEPHRINE 2 %-1:100000 IJ SOLN
INTRAMUSCULAR | Status: AC
  Filled 2013-11-21: qty 5.1

## 2013-11-21 MED ORDER — LACTATED RINGERS IV SOLN
INTRAVENOUS | Status: DC | PRN
  Administered 2013-11-21 (×2): via INTRAVENOUS

## 2013-11-21 MED ORDER — BUPIVACAINE-EPINEPHRINE (PF) 0.5% -1:200000 IJ SOLN
INTRAMUSCULAR | Status: AC
  Filled 2013-11-21: qty 1.8

## 2013-11-21 MED ORDER — BUPIVACAINE-EPINEPHRINE (PF) 0.5% -1:200000 IJ SOLN
INTRAMUSCULAR | Status: DC | PRN
  Administered 2013-11-21: 3.6 mL

## 2013-11-21 MED ORDER — FENTANYL CITRATE 0.05 MG/ML IJ SOLN
INTRAMUSCULAR | Status: DC | PRN
  Administered 2013-11-21: 50 ug via INTRAVENOUS
  Administered 2013-11-21: 100 ug via INTRAVENOUS
  Administered 2013-11-21 (×2): 50 ug via INTRAVENOUS

## 2013-11-21 MED ORDER — HYDROMORPHONE HCL PF 1 MG/ML IJ SOLN
0.2500 mg | INTRAMUSCULAR | Status: DC | PRN
  Administered 2013-11-21 (×2): 0.5 mg via INTRAVENOUS

## 2013-11-21 MED ORDER — ROCURONIUM BROMIDE 100 MG/10ML IV SOLN
INTRAVENOUS | Status: AC
  Filled 2013-11-21: qty 1

## 2013-11-21 MED ORDER — LIDOCAINE-EPINEPHRINE 2 %-1:100000 IJ SOLN
INTRAMUSCULAR | Status: DC | PRN
  Administered 2013-11-21: 10.2 mL

## 2013-11-21 MED ORDER — LIDOCAINE HCL (CARDIAC) 20 MG/ML IV SOLN
INTRAVENOUS | Status: DC | PRN
  Administered 2013-11-21: 50 mg via INTRAVENOUS

## 2013-11-21 MED ORDER — LIDOCAINE-EPINEPHRINE 2 %-1:100000 IJ SOLN
INTRAMUSCULAR | Status: AC
  Filled 2013-11-21: qty 1.7

## 2013-11-21 MED ORDER — PROPOFOL 10 MG/ML IV BOLUS
INTRAVENOUS | Status: DC | PRN
  Administered 2013-11-21: 180 mg via INTRAVENOUS

## 2013-11-21 MED ORDER — PROPOFOL 10 MG/ML IV BOLUS
INTRAVENOUS | Status: AC
  Filled 2013-11-21: qty 20

## 2013-11-21 MED ORDER — LACTATED RINGERS IV SOLN: INTRAVENOUS | Status: DC | PRN

## 2013-11-21 MED ORDER — GLYCOPYRROLATE 0.2 MG/ML IJ SOLN
INTRAMUSCULAR | Status: AC
  Filled 2013-11-21: qty 3

## 2013-11-21 MED ORDER — PROMETHAZINE HCL 25 MG/ML IJ SOLN: 6.2500 mg | INTRAMUSCULAR | Status: DC | PRN

## 2013-11-21 MED ORDER — HYDROMORPHONE HCL PF 1 MG/ML IJ SOLN
INTRAMUSCULAR | Status: AC
  Filled 2013-11-21: qty 1

## 2013-11-21 MED ORDER — LACTATED RINGERS IV SOLN: INTRAVENOUS | Status: DC

## 2013-11-21 MED ORDER — OXYCODONE-ACETAMINOPHEN 5-325 MG PO TABS
1.0000 | ORAL_TABLET | ORAL | Status: DC | PRN
  Administered 2013-11-21: 1 via ORAL
  Filled 2013-11-21: qty 1

## 2013-11-21 MED ORDER — NEOSTIGMINE METHYLSULFATE 10 MG/10ML IV SOLN
INTRAVENOUS | Status: AC
  Filled 2013-11-21: qty 1

## 2013-11-21 SURGICAL SUPPLY — 29 items
ATTRACTOMAT 16X20 MAGNETIC DRP (DRAPES) ×3 IMPLANT
BAG ZIPLOCK 12X15 (MISCELLANEOUS) IMPLANT
BANDAGE EYE OVAL (MISCELLANEOUS) ×6 IMPLANT
BLADE SURG 15 STRL LF DISP TIS (BLADE) ×2 IMPLANT
BLADE SURG 15 STRL SS (BLADE) ×4
CANNULA VESSEL W/WING WO/VALVE (CANNULA) ×3 IMPLANT
GAUZE SPONGE 4X4 12PLY STRL (GAUZE/BANDAGES/DRESSINGS) IMPLANT
GAUZE SPONGE 4X4 16PLY XRAY LF (GAUZE/BANDAGES/DRESSINGS) ×6 IMPLANT
GLOVE BIOGEL PI IND STRL 6 (GLOVE) ×1 IMPLANT
GLOVE BIOGEL PI INDICATOR 6 (GLOVE) ×2
GLOVE SURG ORTHO 8.0 STRL STRW (GLOVE) ×3 IMPLANT
GLOVE SURG SS PI 6.0 STRL IVOR (GLOVE) ×3 IMPLANT
GOWN STRL REUS W/TWL 2XL LVL3 (GOWN DISPOSABLE) ×3 IMPLANT
GOWN STRL REUS W/TWL LRG LVL3 (GOWN DISPOSABLE) ×3 IMPLANT
KIT BASIN OR (CUSTOM PROCEDURE TRAY) ×3 IMPLANT
NS IRRIG 1000ML POUR BTL (IV SOLUTION) ×3 IMPLANT
PACK EENT SPLIT (PACKS) ×3 IMPLANT
PACKING VAGINAL (PACKING) ×3 IMPLANT
SPONGE GAUZE 4X4 12PLY (GAUZE/BANDAGES/DRESSINGS) ×3 IMPLANT
SPONGE SURGIFOAM ABS GEL 12-7 (HEMOSTASIS) ×3 IMPLANT
SUCTION FRAZIER 12FR DISP (SUCTIONS) IMPLANT
SUT CHROMIC 3 0 PS 2 (SUTURE) ×9 IMPLANT
SUT CHROMIC 4 0 P 3 18 (SUTURE) ×3 IMPLANT
SYRINGE 60CC LL (MISCELLANEOUS) ×3 IMPLANT
TOWEL OR 17X26 10 PK STRL BLUE (TOWEL DISPOSABLE) ×3 IMPLANT
TUBING CONNECTING 10 (TUBING) ×2 IMPLANT
TUBING CONNECTING 10' (TUBING) ×1
WATER STERILE IRR 1500ML POUR (IV SOLUTION) ×3 IMPLANT
YANKAUER SUCT BULB TIP NO VENT (SUCTIONS) ×3 IMPLANT

## 2013-11-21 NOTE — Anesthesia Preprocedure Evaluation (Signed)
Anesthesia Evaluation  Patient identified by MRN, date of birth, ID band Patient awake    Reviewed: Allergy & Precautions, H&P , NPO status , Patient's Chart, lab work & pertinent test results  Airway Mallampati: II TM Distance: >3 FB Neck ROM: Full    Dental  (+) Poor Dentition   Pulmonary Current Smoker,  breath sounds clear to auscultation  Pulmonary exam normal       Cardiovascular hypertension, Rhythm:Regular Rate:Normal     Neuro/Psych negative neurological ROS  negative psych ROS   GI/Hepatic negative GI ROS, Neg liver ROS,   Endo/Other  negative endocrine ROS  Renal/GU negative Renal ROS  negative genitourinary   Musculoskeletal negative musculoskeletal ROS (+)   Abdominal   Peds negative pediatric ROS (+)  Hematology negative hematology ROS (+)   Anesthesia Other Findings   Reproductive/Obstetrics negative OB ROS                           Anesthesia Physical Anesthesia Plan  ASA: III  Anesthesia Plan: General   Post-op Pain Management:    Induction: Intravenous  Airway Management Planned: Nasal ETT  Additional Equipment:   Intra-op Plan:   Post-operative Plan: Extubation in OR  Informed Consent: I have reviewed the patients History and Physical, chart, labs and discussed the procedure including the risks, benefits and alternatives for the proposed anesthesia with the patient or authorized representative who has indicated his/her understanding and acceptance.   Dental advisory given  Plan Discussed with: CRNA and Surgeon  Anesthesia Plan Comments:         Anesthesia Quick Evaluation

## 2013-11-21 NOTE — Anesthesia Postprocedure Evaluation (Signed)
  Anesthesia Post-op Note  Patient: Glenda [name]  Procedure(s) Performed: Procedure(s) (LRB): Extraction of tooth #'s 2,6,7,8,9,10,11,21,22,23,24,25,26,27,28,29, 32 with alveoloplasty and bilatreal mandibular tori reductions (N/A)  Patient Location: PACU  Anesthesia Type: General  Level of Consciousness: awake and alert   Airway and Oxygen Therapy: Patient Spontanous Breathing  Post-op Pain: mild  Post-op Assessment: Post-op Vital signs reviewed, Patient's Cardiovascular Status Stable, Respiratory Function Stable, Patent Airway and No signs of Nausea or vomiting  Last Vitals:  Filed Vitals:   11/21/13 1045  BP: 103/87  Pulse: 79  Temp: 36.9 C  Resp: 11    Post-op Vital Signs: stable   Complications: No apparent anesthesia complications

## 2013-11-21 NOTE — Discharge Instructions (Signed)

## 2013-11-21 NOTE — Progress Notes (Signed)
PRE-OPERATIVE NOTE:  11/21/2013 Glenda [name] [id number]  VITALS: BP 126/59  Pulse 70  Temp(Src) 97.9 F (36.6 C) (Oral)  Resp 18  Ht 5\' 4"  (1.626 m)  Wt 134 lb (60.782 kg)  BMI 22.99 kg/m2  SpO2 100%  Lab Results  Component Value Date   WBC 6.8 11/19/2013   HGB 13.3 11/19/2013   HCT 40.0 11/19/2013   MCV 92.6 11/19/2013   PLT 240 11/19/2013   BMET    Component Value Date/Time   NA 140 11/19/2013 1330   K 3.8 11/19/2013 1330   CL 101 11/19/2013 1330   CO2 29 11/19/2013 1330   GLUCOSE 74 11/19/2013 1330   BUN 4* 11/19/2013 1330   CREATININE 0.72 11/19/2013 1330   CALCIUM 9.6 11/19/2013 1330   GFRNONAA >90 11/19/2013 1330   GFRAA >90 11/19/2013 1330    Lab Results  Component Value Date   INR 1.0 RATIO 06/06/2008   No results found for this basename: PTT     Glenda [name] presents for extraction of remaining teeth with alveoplasty and pre-prosthetic surgery as indicated after general anesthesia. Patient denies having any acute or dental changes is surgical clearance was provided by Dr. Humphrey Rolls.   SUBJECTIVE: The patient denies any acute medical or dental changes and agrees to proceed with treatment as planned.  EXAM: No sign of acute dental changes.  ASSESSMENT: Patient is affected by chronic apical periodontitis, history of acute pulpitis, multiple dental caries, chronic periodontitis, and accretions and bilateral manipulative lingual tori.  PLAN: Patient agrees to proceed with treatment as planned in the operating room as previously discussed and accepts the risks, benefits, complications of the proposed treatment. Patient as well as potential consultations of bleeding, bruising, swelling, infection, pain, soft tissue damage, sinus perforation and involvement, root fracture, mandible fracture, and complications of anesthesia. Patient is also aware of the potential for a complications not mentioned above.   Lenn Cal, DDS

## 2013-11-21 NOTE — Transfer of Care (Signed)
Immediate Anesthesia Transfer of Care Note  Patient: Glenda [name]  Procedure(s) Performed: Procedure(s): Extraction of tooth #'s 2,6,7,8,9,10,11,21,22,23,24,25,26,27,28,29, 32 with alveoloplasty and bilatreal mandibular tori reductions (N/A)  Patient Location: PACU  Anesthesia Type:General  Level of Consciousness: awake, alert  and oriented  Airway & Oxygen Therapy: Patient Spontanous Breathing and Patient connected to face mask oxygen  Post-op Assessment: Report given to PACU RN and Post -op Vital signs reviewed and stable  Post vital signs: Reviewed and stable  Complications: No apparent anesthesia complications

## 2013-11-21 NOTE — Op Note (Signed)
Patient:            Glenda [name] Date of Birth:  [birth year] MRN:                [id number]   DATE OF PROCEDURE:  11/21/2013               OPERATIVE REPORT   PREOPERATIVE DIAGNOSES: 1. History of acute pulpitis 2. Chronic apical periodontitis 3. Rampant dental caries 4. Chronic periodontitis 5. Multiple retained root segments 6. Bilateral mandibular lingual tori   POSTOPERATIVE DIAGNOSES: 1. History of acute pulpitis 2. Chronic apical periodontitis 3. Rampant dental caries 4. Chronic periodontitis 5. Multiple retained root segments 6. Bilateral mandibular lingual tori 7. Right lateral tongue ulceration in the area of fractured tooth #32  OPERATIONS: 1. Multiple extraction of tooth numbers 21, 22, 23, 24, 25, 26, 27, 28, 29, and 32 2. 4 Quadrants of alveoloplasty 3. Bilateral mandibular lingual tori reductions    SURGEON: Lenn Cal, DDS  ASSISTANT: Camie Patience, (dental assistant)  ANESTHESIA: General anesthesia via nasoendotracheal tube.  MEDICATIONS: 1. Ancef 2 g IV prior to invasive dental procedures. 2. Local anesthesia with a total utilization of 6 carpules each containing 34 mg of lidocaine with 0.017 mg of epinephrine as well as 2 carpules each containing 9 mg of bupivacaine with 0.009 mg of epinephrine. 3. 30 mg of Toradol IV at the end of the dental medicine procedure  SPECIMENS: There are 17 teeth that were discarded.  DRAINS: None  CULTURES: None  COMPLICATIONS: None   ESTIMATED BLOOD LOSS: 100 mLs.  INTRAVENOUS FLUIDS: 1300 mLs of Lactated ringers solution.  INDICATIONS: The patient was recently diagnosed with acute pulpitis and chronic apical periodontitis.  A dental consultation was then requested to evaluate poor dentition and to rule out dental infection that may affect the patient's systemic health. The patient was examined and treatment planned for extraction of remaining teeth with alveoloplasty and pre-prosthetic surgery as  indicated.  OPERATIVE FINDINGS: Patient was examined in operating room #3.   The teeth were identified for extraction. The patient was noted be affected by chronic periodontitis, chronic apical periodontitis, history of acute pulpitis, multiple dental caries, multiple retained root segments, and the presence of bilateral mandibular lingual tori. Patient was also noted to have a 6 x 8 mm tongue ulceration in the area of fracture tooth #32. This will be followed appropriately and be considered for biopsy if this area does not heal in within 2-3 weeks.   DESCRIPTION OF PROCEDURE: Patient was brought to the main operating room number 3. Patient was then placed in the supine position on the operating table. General anesthesia was then induced per the anesthesia team. The patient was then prepped and draped in the usual manner for dental medicine procedure. A timeout was performed. The patient was identified and procedures were verified. A throat pack was placed at this time. The oral cavity was then thoroughly examined with the findings noted above. The patient was then ready for dental medicine procedure as follows:  Local anesthesia was then administered sequentially with a total utilization of 6 carpules each containing 34 mg of lidocaine with 0.017 mg of epinephrine as well as 2 carpules  each containing 9 mg bupivacaine with 0.009 mg of epinephrine.  The Maxillary left and right quadrants first approached. Anesthesia was then delivered utilizing infiltration with lidocaine with epinephrine. A #15 blade incision was then made from the maxillary right tuberosity and extended to the distal of #13.  A  surgical flap was then carefully reflected. Appropriate amounts of buccal and interseptal bone were then removed utilizing a surgical handpiece and bur and copious amounts of sterile water around tooth numbers 2, 6, and 11.  The teeth were then subluxated with a series of straight elevators. Tooth number 2 was  then removed with a 53R forceps without complications. Tooth numbers 6, 7, 8, 9, 10, 11 were then removed with a 150 forceps without complications. Alveoloplasty was then performed utilizing a ronguers and bone file. The surgical site was then irrigated with copious amounts of sterile saline. The tissues were approximated and trimmed appropriately. The  maxillary right surgical site was then closed from the maxillary right tuberosity and extended to the mesial #8 utilizing 3-0 chromic gut suture in a continuous interrupted suture technique x1. The maxillary left surgical site was then closed from the distal of #13 and extended the mesial #9 utilizing 3-0 chromic gut suture in a continuous interrupted suture technique x1. 2 interrupted sutures then placed to further close the surgical site as needed to  At this point time, the mandibular quadrants were approached. The patient was given bilateral inferior alveolar nerve blocks and long buccal nerve blocks utilizing the bupivacaine with epinephrine. Further infiltration was then achieved utilizing the lidocaine with epinephrine. A 15 blade incision was then made from the distal of number 19 and extended to the distal of #32.  A surgical flap was then carefully reflected. Appropriate amounts of buccal and interseptal bone were then removed utilizing a surgical handpiece and copious amount of sterile water .  The lower teeth were then subluxated with a series of straight elevators. Tooth numbers 21, 22, 23, 24, 25, 26, 27, 28, 29 were then removed utilizing a 151 forceps without complications. The coronal aspect of tooth #32 that removed with a 23 forceps leaving the roots remaining. These roots were then removed with a 151 forceps after further bone removal with a surgical handpiece and bur and copious of sterile saline.  At this point time the surgical flaps were then reflected on the mandibular left and mandibular right lingual aspects to expose the bilateral  mandibular lingual tori. The bilateral mandibular lingual tori were then reduced utilizing a surgical handpiece and bur and copious amounts sterile saline.  Alveoloplasty was then performed utilizing a rongeurs and bone file. The tissues were approximated and trimmed appropriately. The surgical sites were then irrigated with copious amounts of sterile saline X4. The mandibular left surgical site was then closed from the distal of  #19 and extended to the mesial #24 utilizing 3-0 chromic gut suture in a continuous interrupted suture technique x1. The mandibular right surgical site was then closed from the distal of #32 and extended the mesial numbers 25 utilizing 3-0 chromic gut suture in a continuous interrupted suture technique x1. 3 individual interrupted sutures were then placed to further close the surgical site as needed.    At this point time, the entire mouth was irrigated with copious amounts of sterile saline. The patient was examined for complications, seeing none, the dental medicine procedure was deemed to be complete. The throat pack was removed at this time. A series of 4 x 4 gauze were placed in the mouth to aid hemostasis.  An oral airway was then placed in the request of the anesthesia team. The patient was then handed over to the anesthesia team for final disposition. After an appropriate amount of time, the patient was extubated and taken to the  postanesthsia care unit with stable vital signs and a good condition. All counts were correct for the dental medicine procedure.The patient was given appropriate postoperative pain medication by her pain specialist by patient report. Patient will be seen approximately 7-10 days for evaluation of healing and suture removal as needed.   Lenn Cal, DDS.

## 2013-11-21 NOTE — H&P (Signed)
11/21/2013  Patient:            Glenda [name] Date of Birth:  [birth year] MRN:                [id number]  BP 126/59  Pulse 70  Temp(Src) 97.9 F (36.6 C) (Oral)  Resp 18  Ht 5\' 4"  (1.626 m)  Wt 134 lb (60.782 kg)  BMI 22.99 kg/m2  SpO2 100%   [name]  is a 58 year old female presents for extraction remaining teeth with alveoloplasty and pre-prosthetic surgery as indicated after general anesthesia via nasoendotracheal tube. The patient denies any acute medical or dental changes since recent evaluation by Dr. Humphrey Rolls. Please see H and P and physical exam notes of Dr. Humphrey Rolls for use as surgical clearance and H and P for dental OR procedure. Copies of Medical Progress Notes are in the OR chart and are to be added to [facility] per protocols.   Lenn Cal, DDS

## 2013-11-22 ENCOUNTER — Encounter (HOSPITAL_COMMUNITY): Payer: Self-pay | Admitting: Dentistry

## 2013-12-02 ENCOUNTER — Ambulatory Visit (HOSPITAL_COMMUNITY): Payer: Medicaid - Dental | Admitting: Dentistry

## 2013-12-02 ENCOUNTER — Encounter (HOSPITAL_COMMUNITY): Payer: Self-pay | Admitting: Dentistry

## 2013-12-02 VITALS — BP 114/75 | HR 69 | Temp 98.4°F

## 2013-12-02 DIAGNOSIS — K000 Anodontia: Secondary | ICD-10-CM

## 2013-12-02 DIAGNOSIS — K0820 Unspecified atrophy of edentulous alveolar ridge: Secondary | ICD-10-CM

## 2013-12-02 DIAGNOSIS — K08409 Partial loss of teeth, unspecified cause, unspecified class: Secondary | ICD-10-CM

## 2013-12-02 DIAGNOSIS — K08109 Complete loss of teeth, unspecified cause, unspecified class: Secondary | ICD-10-CM

## 2013-12-02 NOTE — Progress Notes (Signed)
POST OPERATIVE NOTE:  12/02/2013 Glenda [name] [id number]  VITALS: BP 114/75  Pulse 69  Temp(Src) 98.4 F (36.9 C) (Oral)  LABS:  Lab Results  Component Value Date   WBC 6.8 11/19/2013   HGB 13.3 11/19/2013   HCT 40.0 11/19/2013   MCV 92.6 11/19/2013   PLT 240 11/19/2013   BMET    Component Value Date/Time   NA 140 11/19/2013 1330   K 3.8 11/19/2013 1330   CL 101 11/19/2013 1330   CO2 29 11/19/2013 1330   GLUCOSE 74 11/19/2013 1330   BUN 4* 11/19/2013 1330   CREATININE 0.72 11/19/2013 1330   CALCIUM 9.6 11/19/2013 1330   GFRNONAA >90 11/19/2013 1330   GFRAA >90 11/19/2013 1330    Lab Results  Component Value Date   INR 1.0 RATIO 06/06/2008   No results found for this basename: PTT     Glenda [name] is status post extraction of remaining teeth with alveoloplasty and pre-prosthetic surgery as indicated in the operating room on 11/21/2013.  SUBJECTIVE: Patient with minimal discomfort. Patient indicates that several stitches remain involving the lower right quadrant.   EXAM: There is no sign of infection, heme, or ooze. Sutures remain involving the lower right quadrant. Generalized primary closure is noted. Patient will need to heal in by secondary intention in the molar site of the maxillary right quadrant. Patient has some extraoral ecchymoses that are resolving.  PROCEDURE: The patient was given a chlorhexidine gluconate rinse for 30 seconds. Sutures were then removed without complication. Patient tolerated the procedure well.  ASSESSMENT: Post operative course is consistent with dental procedures performed in the operating room. 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  PLAN: Continue saltwater rinses as needed to aid healing. Brush tongue daily.  Maintain soft diet and supplements with Ensure plus or Boost. Return to clinic as scheduled for start of upper lower complete denture fabrication.   Lenn Cal, DDS

## 2013-12-02 NOTE — Patient Instructions (Signed)
Continue saltwater rinses as needed to aid healing. Brush tongue daily.  Maintain soft diet and supplements with Ensure plus or Boost. Return to clinic as scheduled for start of upper lower complete denture fabrication. Lenn Cal, DDS

## 2013-12-05 DIAGNOSIS — G894 Chronic pain syndrome: Secondary | ICD-10-CM | POA: Diagnosis not present

## 2013-12-05 DIAGNOSIS — Z87891 Personal history of nicotine dependence: Secondary | ICD-10-CM | POA: Diagnosis not present

## 2013-12-05 DIAGNOSIS — Z79899 Other long term (current) drug therapy: Secondary | ICD-10-CM | POA: Diagnosis not present

## 2013-12-05 DIAGNOSIS — F17200 Nicotine dependence, unspecified, uncomplicated: Secondary | ICD-10-CM | POA: Diagnosis not present

## 2013-12-05 DIAGNOSIS — R51 Headache: Secondary | ICD-10-CM | POA: Diagnosis not present

## 2013-12-24 ENCOUNTER — Encounter (HOSPITAL_COMMUNITY): Payer: Self-pay | Admitting: Dentistry

## 2013-12-24 ENCOUNTER — Ambulatory Visit (HOSPITAL_COMMUNITY): Payer: Medicaid - Dental | Admitting: Dentistry

## 2013-12-24 VITALS — BP 120/76 | HR 74 | Temp 98.1°F

## 2013-12-24 DIAGNOSIS — K0820 Unspecified atrophy of edentulous alveolar ridge: Secondary | ICD-10-CM

## 2013-12-24 DIAGNOSIS — K08109 Complete loss of teeth, unspecified cause, unspecified class: Secondary | ICD-10-CM

## 2013-12-24 DIAGNOSIS — K000 Anodontia: Principal | ICD-10-CM

## 2013-12-24 DIAGNOSIS — Z463 Encounter for fitting and adjustment of dental prosthetic device: Secondary | ICD-10-CM

## 2013-12-24 NOTE — Patient Instructions (Signed)
Return to clinic as scheduled for continued fabrication of upper and lower complete dentures. Lenn Cal, DDS

## 2013-12-24 NOTE — Progress Notes (Signed)
12/24/2013  Patient:            Glenda [name] Date of Birth:  12-Feb-1956 MRN:                [id number]  BP 120/76  Pulse 74  Temp(Src) 98.1 F (36.7 C)   BERNARDINA CACHO presents for start of upper and lower denture fabrication. Exam: Patient is edentulous. Discussed procedures involved in upper and lower denture fabrication and prognosis for successful ability to wear dentures. Price for dentures confirmed.  Patient agrees to proceed with upper and lower denture fabrication. Procedure:  Upper and lower denture primary impressions in alginate. Lab pour. To [facility] for upper and lower denture custom tray fabrication. RTC for upper and lower denture final impressions.  Lenn Cal, DDS

## 2013-12-30 ENCOUNTER — Encounter (HOSPITAL_COMMUNITY): Payer: Self-pay | Admitting: Dentistry

## 2014-01-01 ENCOUNTER — Encounter (HOSPITAL_COMMUNITY): Payer: Self-pay | Admitting: Dentistry

## 2014-01-02 ENCOUNTER — Ambulatory Visit (HOSPITAL_COMMUNITY): Payer: Self-pay | Admitting: Dentistry

## 2014-01-02 ENCOUNTER — Encounter (HOSPITAL_COMMUNITY): Payer: Self-pay | Admitting: Dentistry

## 2014-01-02 VITALS — BP 122/82 | HR 66 | Temp 98.2°F

## 2014-01-02 DIAGNOSIS — K08109 Complete loss of teeth, unspecified cause, unspecified class: Secondary | ICD-10-CM

## 2014-01-02 NOTE — Patient Instructions (Signed)
Return to clinic as scheduled for continued upper and lower complete denture fabrication. Dr. Kulinski 

## 2014-01-02 NOTE — Progress Notes (Signed)
01/02/2014  Patient:            Glenda [name] Date of Birth:  04/22/1956 MRN:                [id number]  BP 122/82  Pulse 66  Temp(Src) 98.2 F (36.8 C)  Glenda [name] presents for continued upper and lower complete denture fabrication. Procedure:  Upper and lower border molding and final impressions in Aquasil. Patient tolerated procedure well. To [facility] for custom baseplates with rims. Return to clinic for upper and lower complete denture jaw relations.  Lenn Cal, DDS

## 2014-01-09 DIAGNOSIS — M818 Other osteoporosis without current pathological fracture: Secondary | ICD-10-CM | POA: Diagnosis not present

## 2014-01-09 DIAGNOSIS — F3289 Other specified depressive episodes: Secondary | ICD-10-CM | POA: Diagnosis not present

## 2014-01-09 DIAGNOSIS — K219 Gastro-esophageal reflux disease without esophagitis: Secondary | ICD-10-CM | POA: Diagnosis not present

## 2014-01-09 DIAGNOSIS — F411 Generalized anxiety disorder: Secondary | ICD-10-CM | POA: Diagnosis not present

## 2014-01-09 DIAGNOSIS — F329 Major depressive disorder, single episode, unspecified: Secondary | ICD-10-CM | POA: Diagnosis not present

## 2014-01-09 DIAGNOSIS — R51 Headache: Secondary | ICD-10-CM | POA: Diagnosis not present

## 2014-01-13 ENCOUNTER — Encounter (HOSPITAL_COMMUNITY): Payer: Self-pay | Admitting: Dentistry

## 2014-01-13 ENCOUNTER — Encounter (INDEPENDENT_AMBULATORY_CARE_PROVIDER_SITE_OTHER): Payer: Self-pay

## 2014-01-13 ENCOUNTER — Ambulatory Visit (HOSPITAL_COMMUNITY): Payer: Medicaid - Dental | Admitting: Dentistry

## 2014-01-13 DIAGNOSIS — K08109 Complete loss of teeth, unspecified cause, unspecified class: Secondary | ICD-10-CM

## 2014-01-13 NOTE — Patient Instructions (Signed)
Return to clinic as scheduled for continuation of upper lower complete denture fabrication. Dr. Enrique Sack

## 2014-01-13 NOTE — Progress Notes (Signed)
01/13/2014  Patient:            Glenda [name] Date of Birth:  June 29, 1955 MRN:                [id number]  BP 116/63  Pulse 69  Temp(Src) 97.7 F (36.5 C) (Oral)  [name] presents for continued denture fabrication. Procedure:  Upper and lower denture Jaw relations with aluwax bite registration. Patient agrees to tooth selection of 75 E, N, and  10 degree posteriors to match with Portrait A2 shade. Lab to consider 22E, H if this fits curve of ridge better. Patient tolerated procedure well. RTC for denture wax try in.   Lenn Cal, DDS

## 2014-01-22 ENCOUNTER — Encounter (HOSPITAL_COMMUNITY): Payer: Self-pay | Admitting: Dentistry

## 2014-01-23 ENCOUNTER — Ambulatory Visit (HOSPITAL_COMMUNITY): Payer: Medicaid - Dental | Admitting: Dentistry

## 2014-01-23 ENCOUNTER — Encounter (INDEPENDENT_AMBULATORY_CARE_PROVIDER_SITE_OTHER): Payer: Self-pay

## 2014-01-23 ENCOUNTER — Encounter (HOSPITAL_COMMUNITY): Payer: Self-pay | Admitting: Dentistry

## 2014-01-23 VITALS — BP 125/75 | HR 91 | Temp 98.4°F

## 2014-01-23 DIAGNOSIS — Z463 Encounter for fitting and adjustment of dental prosthetic device: Secondary | ICD-10-CM

## 2014-01-23 DIAGNOSIS — K0820 Unspecified atrophy of edentulous alveolar ridge: Secondary | ICD-10-CM

## 2014-01-23 DIAGNOSIS — K08109 Complete loss of teeth, unspecified cause, unspecified class: Secondary | ICD-10-CM

## 2014-01-23 DIAGNOSIS — K000 Anodontia: Principal | ICD-10-CM

## 2014-01-23 DIAGNOSIS — R682 Dry mouth, unspecified: Secondary | ICD-10-CM

## 2014-01-23 DIAGNOSIS — K117 Disturbances of salivary secretion: Secondary | ICD-10-CM

## 2014-01-23 NOTE — Progress Notes (Signed)
01/23/2014  Patient:            Glenda [name] Date of Birth:  13-Aug-1955 MRN:                [id number]  BP 125/75  Pulse 91  Temp(Src) 98.4 F (36.9 C) (Oral)  [name] presents for continued upper and lower denture fabrication. Procedure:  Upper and lower denture wax tryin. Patient accepts esthetics, phonetics, fit and function. Will plan on adjusting 10/11 and 22/23 to be less facially prominent. Patient agrees to process in [place] [address] with changes as above. Patient to RTC for  upper and lower denture insertion.  Lenn Cal, DDS

## 2014-01-23 NOTE — Patient Instructions (Signed)
Return to clinic as scheduled for insertion of upper lower complete dentures. Dr. Kulinski 

## 2014-01-30 ENCOUNTER — Encounter (HOSPITAL_COMMUNITY): Payer: Self-pay | Admitting: Dentistry

## 2014-01-30 ENCOUNTER — Encounter (INDEPENDENT_AMBULATORY_CARE_PROVIDER_SITE_OTHER): Payer: Self-pay

## 2014-01-30 ENCOUNTER — Ambulatory Visit (HOSPITAL_COMMUNITY): Payer: Medicaid - Dental | Admitting: Dentistry

## 2014-01-30 VITALS — BP 133/71 | HR 102 | Temp 98.3°F

## 2014-01-30 DIAGNOSIS — K0820 Unspecified atrophy of edentulous alveolar ridge: Secondary | ICD-10-CM

## 2014-01-30 DIAGNOSIS — K117 Disturbances of salivary secretion: Secondary | ICD-10-CM

## 2014-01-30 DIAGNOSIS — K08109 Complete loss of teeth, unspecified cause, unspecified class: Secondary | ICD-10-CM

## 2014-01-30 DIAGNOSIS — Z463 Encounter for fitting and adjustment of dental prosthetic device: Secondary | ICD-10-CM

## 2014-01-30 DIAGNOSIS — R682 Dry mouth, unspecified: Secondary | ICD-10-CM

## 2014-01-30 DIAGNOSIS — K000 Anodontia: Principal | ICD-10-CM

## 2014-01-30 NOTE — Patient Instructions (Signed)
Instructions for Denture Use and Care  Congratulations, you are on the way to oral rehabilitation!  You have just received a new set of complete or partial dentures.  These prostheses will help to improve both your appearance and chewing ability.  These instructions will help you get adjusted to your dentures as well as care for them properly.  Please read these instructions carefully and completely as soon as you get home.  If you or your caregiver have any questions please notify the  Dental Clinic at [phone].  HOW YOUR DENTURES LOOK AND FEEL Soon after you begin wearing your dentures, you may feel that your dentures are too large or even loose.  As our mouth and facial muscles become accustomed to the dentures, these feelings will go away.  You also may feel that you are salivating more than you normally do.  This feeling should go away as you get used to having the dentures in your mouth.  You may bite your cheek or your tongue; this will eventually resolve itself as you wear your dentures.  Some soreness is to be expected, but you should not hurt.  If your mouth hurts, call your dentist.  A denture adhesive may occasionally be necessary to hold your dentures in place more securely.  The dentist will let you know when one is recommended for you.  SPEAKING Wearing dentures will change the sound of your voice initially.  This will be noticed by you more than anyone else.  Bite and swallow before you speak, in order to place your dentures in position so that you may speak more clearly.  Practice speaking by reading aloud or counting from 1 to 100 very slowly and distinctly.  After some practice your mouth will become accustomed to your dentures and you will speak more clearly.  EATING Chewing will definitely be different after you receive your dentures.  With a little practice and patience you should be able to eat just about any kind of food.  Begin by eating small quantities of food  that are cut into small pieces.  Star with soft foods such as eggs, cooked vegetables, or puddings.  As you gain confidence advance  Your diet to whatever texture foods you can tolerate.  DENTURE CARE Dentures can collect plaque and calculus much the same as natural teeth can.  If not removed on a regular basis, your dentures will not look or feel clean, and you will experience denture odor.  It is very important that you remove your dentures at bedtime and clean them thoroughly.  You should: 1. Clean your dentures over a sink full of water so if dropped, breakage will be prevented. 2. Rinse your dentures with cool water to remove any large food particles. 3. Use soap and water or a denture cleanser or paste to clean the dentures.  Do not use regular toothpaste as it may abrade the denture base or teeth. 4. Use a moistened denture brush to clean all surfaces (inside and outside). 5. Rinse thoroughly to remove any remaining soap or denture cleanser. 6. Use a soft bristle toothbrush to gently brush any natural teeth, gums, tongue, and palate at bedtime and before reinserting your dentures. 7. Do not sleep with your dentures in your mouth at night.  Remove your dentures and soak them overnight in a denture cup filled with water or denture solution as recommended by your dentist.  This routine will become second nature and will increase the life and comfort   of your dentures.  Please do not try to adjust these dentures yourself; you could damage them.  FOLLOW-UP You should call or make an appointment with your dentist.  Your dentist would like to see you at least once a year for a check-up and examination. 

## 2014-01-30 NOTE — Progress Notes (Signed)
01/30/2014  Patient:            ILMA ACHEE Date of Birth:  August 17, 1955 MRN:                [id number]  BP 133/71  Pulse 102  Temp(Src) 98.3 F (36.8 C) (Oral)   [name] presents for insertion of upper and lower complete dentures. Procedure: Pressure indicating paste was applied to the dentures. Adjustments were made as needed. [place] Occlusion evaluated and adjustments made as needed for Centric Relation and protrusive strokes. Good esthetics, phonetics, fit, and function noted. Patient accepts results. Post op instructions provided in written and verbal formats on use and care of dentures. Gave patient denture brush and cup. Patient to keep dentures out if sore spots develop. Use salt water rinses as needed to aid healing. Return to clinic as scheduled for denture adjustment.  Call if problems arise before then. Patient dismissed in stable condition.  Lenn Cal, DDS

## 2014-02-04 ENCOUNTER — Encounter (HOSPITAL_COMMUNITY): Payer: Self-pay | Admitting: Dentistry

## 2014-02-11 ENCOUNTER — Ambulatory Visit (HOSPITAL_COMMUNITY): Payer: Medicaid - Dental | Admitting: Dentistry

## 2014-02-11 ENCOUNTER — Encounter (HOSPITAL_COMMUNITY): Payer: Self-pay | Admitting: Dentistry

## 2014-02-11 ENCOUNTER — Encounter (INDEPENDENT_AMBULATORY_CARE_PROVIDER_SITE_OTHER): Payer: Self-pay

## 2014-02-11 VITALS — BP 118/66 | HR 80 | Temp 98.1°F

## 2014-02-11 DIAGNOSIS — K117 Disturbances of salivary secretion: Secondary | ICD-10-CM

## 2014-02-11 DIAGNOSIS — K08109 Complete loss of teeth, unspecified cause, unspecified class: Secondary | ICD-10-CM

## 2014-02-11 DIAGNOSIS — K000 Anodontia: Principal | ICD-10-CM

## 2014-02-11 DIAGNOSIS — Z972 Presence of dental prosthetic device (complete) (partial): Secondary | ICD-10-CM

## 2014-02-11 DIAGNOSIS — K0820 Unspecified atrophy of edentulous alveolar ridge: Secondary | ICD-10-CM

## 2014-02-11 DIAGNOSIS — Z463 Encounter for fitting and adjustment of dental prosthetic device: Secondary | ICD-10-CM

## 2014-02-11 DIAGNOSIS — R682 Dry mouth, unspecified: Secondary | ICD-10-CM

## 2014-02-11 NOTE — Progress Notes (Signed)
02/11/2014  Patient:            Glenda [name] Date of Birth:  [birth year] MRN:                [id number]  BP 118/66  Pulse 80  Temp(Src) 98.1 F (36.7 C) (Oral)   [name] presents for evaluation of recently inserted upper and lower complete dentures. SUBJECTIVE: Patient with minimal complaints. Patient did have some denture irritation to maxillary right and left facial alveolar ridge areas. OBJECTIVE: There is no sign of denture irritation or erythema. Xerostomia as before. Procedure: Pressure indicating paste was applied to the dentures. Adjustments were made as needed. [place] Occlusion evaluated and adjustments made as needed for Centric Relation and protrusive strokes. Patient accepts results. Patient to keep dentures out if sore spots develop. Use salt water rinses as needed to aid healing. Return to clinic as scheduled for denture adjustment.   Call if problems arise before then.  Lenn Cal, DDS

## 2014-02-11 NOTE — Patient Instructions (Signed)
Patient to keep dentures out if sore spots develop. Use salt water rinses as needed to aid healing. Return to clinic as scheduled for denture adjustment.   Call if problems arise before then.  Keria Widrig F. Mirta Mally, DDS  

## 2014-02-27 DIAGNOSIS — Z716 Tobacco abuse counseling: Secondary | ICD-10-CM | POA: Diagnosis not present

## 2014-02-27 DIAGNOSIS — G894 Chronic pain syndrome: Secondary | ICD-10-CM | POA: Diagnosis not present

## 2014-02-27 DIAGNOSIS — Z720 Tobacco use: Secondary | ICD-10-CM | POA: Diagnosis not present

## 2014-02-27 DIAGNOSIS — Z79891 Long term (current) use of opiate analgesic: Secondary | ICD-10-CM | POA: Diagnosis not present

## 2014-02-27 DIAGNOSIS — Z87891 Personal history of nicotine dependence: Secondary | ICD-10-CM | POA: Diagnosis not present

## 2014-02-27 DIAGNOSIS — G44221 Chronic tension-type headache, intractable: Secondary | ICD-10-CM | POA: Diagnosis not present

## 2014-04-15 ENCOUNTER — Encounter (HOSPITAL_COMMUNITY): Payer: Self-pay | Admitting: Dentistry

## 2014-05-06 DIAGNOSIS — M810 Age-related osteoporosis without current pathological fracture: Secondary | ICD-10-CM | POA: Diagnosis not present

## 2014-05-06 DIAGNOSIS — M818 Other osteoporosis without current pathological fracture: Secondary | ICD-10-CM | POA: Diagnosis not present

## 2014-05-06 DIAGNOSIS — E559 Vitamin D deficiency, unspecified: Secondary | ICD-10-CM | POA: Diagnosis not present

## 2014-05-06 DIAGNOSIS — F329 Major depressive disorder, single episode, unspecified: Secondary | ICD-10-CM | POA: Diagnosis not present

## 2014-05-06 DIAGNOSIS — F411 Generalized anxiety disorder: Secondary | ICD-10-CM | POA: Diagnosis not present

## 2014-05-06 DIAGNOSIS — K219 Gastro-esophageal reflux disease without esophagitis: Secondary | ICD-10-CM | POA: Diagnosis not present

## 2014-05-06 DIAGNOSIS — E782 Mixed hyperlipidemia: Secondary | ICD-10-CM | POA: Diagnosis not present

## 2014-05-06 DIAGNOSIS — H6980 Other specified disorders of Eustachian tube, unspecified ear: Secondary | ICD-10-CM | POA: Diagnosis not present

## 2014-05-16 HISTORY — PX: BREAST BIOPSY: SHX20

## 2014-05-22 DIAGNOSIS — Z87891 Personal history of nicotine dependence: Secondary | ICD-10-CM | POA: Diagnosis not present

## 2014-05-22 DIAGNOSIS — Z716 Tobacco abuse counseling: Secondary | ICD-10-CM | POA: Diagnosis not present

## 2014-05-22 DIAGNOSIS — G894 Chronic pain syndrome: Secondary | ICD-10-CM | POA: Diagnosis not present

## 2014-05-22 DIAGNOSIS — G44221 Chronic tension-type headache, intractable: Secondary | ICD-10-CM | POA: Diagnosis not present

## 2014-05-22 DIAGNOSIS — G43011 Migraine without aura, intractable, with status migrainosus: Secondary | ICD-10-CM | POA: Diagnosis not present

## 2014-05-22 DIAGNOSIS — Z79891 Long term (current) use of opiate analgesic: Secondary | ICD-10-CM | POA: Diagnosis not present

## 2014-05-22 DIAGNOSIS — F17200 Nicotine dependence, unspecified, uncomplicated: Secondary | ICD-10-CM | POA: Diagnosis not present

## 2014-05-22 DIAGNOSIS — Z720 Tobacco use: Secondary | ICD-10-CM | POA: Diagnosis not present

## 2014-05-28 DIAGNOSIS — R0602 Shortness of breath: Secondary | ICD-10-CM | POA: Diagnosis not present

## 2014-05-28 DIAGNOSIS — E569 Vitamin deficiency, unspecified: Secondary | ICD-10-CM | POA: Diagnosis not present

## 2014-05-28 DIAGNOSIS — Z0001 Encounter for general adult medical examination with abnormal findings: Secondary | ICD-10-CM | POA: Diagnosis not present

## 2014-05-28 DIAGNOSIS — H6693 Otitis media, unspecified, bilateral: Secondary | ICD-10-CM | POA: Diagnosis not present

## 2014-05-28 DIAGNOSIS — F17210 Nicotine dependence, cigarettes, uncomplicated: Secondary | ICD-10-CM | POA: Diagnosis not present

## 2014-05-28 DIAGNOSIS — R51 Headache: Secondary | ICD-10-CM | POA: Diagnosis not present

## 2014-05-28 DIAGNOSIS — M818 Other osteoporosis without current pathological fracture: Secondary | ICD-10-CM | POA: Diagnosis not present

## 2014-06-02 ENCOUNTER — Encounter (HOSPITAL_COMMUNITY): Payer: Self-pay | Admitting: Dentistry

## 2014-06-09 DIAGNOSIS — Z13820 Encounter for screening for osteoporosis: Secondary | ICD-10-CM | POA: Diagnosis not present

## 2014-06-09 DIAGNOSIS — Z1231 Encounter for screening mammogram for malignant neoplasm of breast: Secondary | ICD-10-CM | POA: Diagnosis not present

## 2014-06-09 DIAGNOSIS — N63 Unspecified lump in breast: Secondary | ICD-10-CM | POA: Diagnosis not present

## 2014-06-09 DIAGNOSIS — Z780 Asymptomatic menopausal state: Secondary | ICD-10-CM | POA: Diagnosis not present

## 2014-06-09 DIAGNOSIS — M8588 Other specified disorders of bone density and structure, other site: Secondary | ICD-10-CM | POA: Diagnosis not present

## 2014-06-09 DIAGNOSIS — R937 Abnormal findings on diagnostic imaging of other parts of musculoskeletal system: Secondary | ICD-10-CM | POA: Diagnosis not present

## 2014-06-17 DIAGNOSIS — N63 Unspecified lump in breast: Secondary | ICD-10-CM | POA: Diagnosis not present

## 2014-07-04 DIAGNOSIS — N63 Unspecified lump in breast: Secondary | ICD-10-CM | POA: Diagnosis not present

## 2014-07-07 ENCOUNTER — Ambulatory Visit: Payer: Self-pay | Admitting: Surgery

## 2014-07-07 DIAGNOSIS — N63 Unspecified lump in breast: Secondary | ICD-10-CM | POA: Diagnosis not present

## 2014-07-07 DIAGNOSIS — N6082 Other benign mammary dysplasias of left breast: Secondary | ICD-10-CM | POA: Diagnosis not present

## 2014-07-07 DIAGNOSIS — N6012 Diffuse cystic mastopathy of left breast: Secondary | ICD-10-CM | POA: Diagnosis not present

## 2014-07-21 DIAGNOSIS — G44221 Chronic tension-type headache, intractable: Secondary | ICD-10-CM | POA: Diagnosis not present

## 2014-07-21 DIAGNOSIS — G43011 Migraine without aura, intractable, with status migrainosus: Secondary | ICD-10-CM | POA: Diagnosis not present

## 2014-07-21 DIAGNOSIS — Z79891 Long term (current) use of opiate analgesic: Secondary | ICD-10-CM | POA: Diagnosis not present

## 2014-07-21 DIAGNOSIS — Z716 Tobacco abuse counseling: Secondary | ICD-10-CM | POA: Diagnosis not present

## 2014-07-21 DIAGNOSIS — G894 Chronic pain syndrome: Secondary | ICD-10-CM | POA: Diagnosis not present

## 2014-07-21 DIAGNOSIS — F17200 Nicotine dependence, unspecified, uncomplicated: Secondary | ICD-10-CM | POA: Diagnosis not present

## 2014-07-21 DIAGNOSIS — Z720 Tobacco use: Secondary | ICD-10-CM | POA: Diagnosis not present

## 2014-09-04 DIAGNOSIS — J0101 Acute recurrent maxillary sinusitis: Secondary | ICD-10-CM | POA: Diagnosis not present

## 2014-09-04 DIAGNOSIS — E559 Vitamin D deficiency, unspecified: Secondary | ICD-10-CM | POA: Diagnosis not present

## 2014-09-04 DIAGNOSIS — F411 Generalized anxiety disorder: Secondary | ICD-10-CM | POA: Diagnosis not present

## 2014-09-04 DIAGNOSIS — G43019 Migraine without aura, intractable, without status migrainosus: Secondary | ICD-10-CM | POA: Diagnosis not present

## 2014-09-05 NOTE — Discharge Summary (Signed)
PATIENT NAME:  Glenda Glenda [name], Glenda [name] MR#:  [id number] DATE OF BIRTH:  20-Sep-1955  DATE OF ADMISSION:  05/14/2012 DATE OF DISCHARGE:  05/17/2012  HOSPITAL COURSE: See dictated history and physical for details of admission. This 59 year old woman came to the Emergency Room under involuntary commitment after allegedly making suicidal threats at home and cutting herself on the arm. By the time I first saw her in the hospital, she was claiming that she did not remember any of the events that led to admission. Her affect was calm, she was not reporting symptoms of depression, and was completely denying suicidal ideation. In the hospital, she was treated with Paxil for her chronic anxiety and had p.r.n. Ativan, but did not require any other medication, did not require any detox. Review of her history suggested to me that overuse of a combination of Flexeril, Ativan, and Fioricet had probably contributed to a blackout state in which she had her behavior problems. This was presented to the patient and she agreed that it made sense to her. Here in the hospital, her affect has been calm, there has been no sign of psychosis. At this point, she is fully agreeable to following up with outpatient treatment in the community and will be referred for follow-up psychiatric care. Medically, she is stable. She is agreeable to the plan.   DISCHARGE MEDICATIONS: Premarin 0.3 mg per day, Prilosec 20 mg twice a day,  Paxil 40 mg per day, Pravachol 40 mg at bedtime.   LABORATORY RESULTS: Admission labs included a CBC which was normal, chemistry panel showed a BUN slightly low at 5, a bilirubin low at 0.1, neither of particular significance probably. Alcohol undetectable. TSH initially elevated at 5.47. Her urine analysis was normal. Drug screen positive for tricyclics and barbiturates. Acetaminophen and salicylates normal.   MENTAL STATUS EXAMINATION AT DISCHARGE: Neatly dressed and groomed woman who looks her stated age. Good eye  contact, normal psychomotor activity. Speech normal rate, tone, and volume. Affect euthymic, reactive, and appropriate. Mood stated as being fine. Thoughts are lucid without loosening of associations or delusional thinking. Denies auditory or visual hallucinations. Denies suicidal or homicidal ideation. Shows good insight and judgment improved overall. Good short and long-term memory. Normal baseline intelligence.   DIAGNOSIS PRINCIPAL AND PRIMARY: AXIS I:  Mood disorder not otherwise specified.  SECONDARY DIAGNOSES: AXIS I:  Barbiturate intoxication. Rule out benzodiazepine abuse.  AXIS II:  Deferred.  AXIS III:  Chronic headaches.  AXIS IV:  Moderate stress from conflict with her family.  AXIS V:  Functioning at time of discharge: 62.     ____________________________ Gonzella Lex, MD jtc:es D: 05/17/2012 13:20:43 ET T: 05/17/2012 13:44:21 ET JOB#: [id number]  cc: Gonzella Lex, MD, <Dictator> Gonzella Lex MD ELECTRONICALLY SIGNED 05/17/2012 15:37

## 2014-09-05 NOTE — H&P (Signed)
PATIENT NAME:  Glenda [name], Glenda [name] MR#:  [id number] DATE OF BIRTH:  Mar 10, 1956  DATE OF ADMISSION:  05/14/2012  IDENTIFYING INFORMATION AND CHIEF COMPLAINT:  A 59 year old woman brought to the Emergency Room under involuntary commitment taken out by her family.   CHIEF COMPLAINT:  "I don't remember anything."   HISTORY OF PRESENT ILLNESS:  Information obtained from the patient and from the chart. The patient tells me that she knows that she had gotten into a fight with her family at home, but does not remember much about it. In the Emergency Room, she had told the admitting staff that she had been fighting with her daughter because she thinks that her daughter is always trying to get back at her over a long-standing argument they have. When I saw her, the patient said she did not even remember that much of the situation. In the Emergency Room, it was stated that she had taken a dull knife and cut herself, but at that time did not know why. She tells me that she is not aware of having been more depressed recently. She denies that she had any knowledge of having suicidal ideation. She says that she has been taking her usual medication from her outpatient doctor, Dr. Brunetta Genera, as prescribed. She denies having any psychotic symptoms.   PAST PSYCHIATRIC HISTORY:  The patient does have a history of depression and suicide attempts in the past. Previous depression had been in response to her father's suicide. She had been treated successfully with antidepressants previously. She does have a history of prior hospitalizations. Recently has not been following up with an outpatient psychiatrist, but has been getting all of her medication from Dr. Brunetta Genera.   PAST MEDICAL HISTORY:  History of dyslipidemia, history of gastric reflux symptoms and history of chronic musculoskeletal pain and headaches.   SOCIAL HISTORY:  Lives with her husband. The daughter appears to live at least part time at home as well. The patient is  not working. There seems to be chronic tension in the home.   SUBSTANCE ABUSE HISTORY:  Did not appear to previously have a known history of substance abuse problems.   CURRENT MEDICATIONS:  Pravachol 40 mg per day, Paxil 40 mg per day, Zofran 4 mg q. 6 hours p.r.n. for nausea, Prilosec 20 mg twice a day, Ativan 1 mg twice a day, Flexeril 10 mg a day as needed, Fioricet 1 tablet every 4 hours as needed, Premarin 0.3 mg per day.   ALLERGIES:  MORPHINE.   REVIEW OF SYSTEMS:  The patient when I interviewed her denied having acute depression. Denied suicidal ideation. Denied hallucinations. Denied panic attacks. Reported that she did not have any acute physical complaints other than her headache which had been going on for several days.   MENTAL STATUS EXAMINATION:  Casually dressed, neatly groomed woman looks her stated age, cooperative with the interview. Good eye contact, normal psychomotor activity. Speech normal in rate, tone and volume. Affect euthymic, slightly blunted. Mood stated as okay. Thoughts appear lucid and directed. No evidence of thought disorder or psychotic thinking. Denies auditory or visual hallucinations. Denies suicidal or homicidal ideation. Short-term and longer term memory both impaired. Judgment and insight appear impaired. Baseline intelligence appears to probably be average.   PHYSICAL EXAMINATION: GENERAL: The patient does not appear to be in any acute physical distress. He does have small scratches on her left arm, nothing that needs any further treatment. No other skin lesions.  HEENT: Pupils equal and reactive. Face  symmetric. Oral mucosa normal.  NECK AND BACK: Nontender.  MUSCULOSKELETAL: Full range of motion at all extremities. Gait within normal limits. Strength and reflexes normal throughout.  NEUROLOGICAL: Cranial nerves symmetric.  LUNGS: Clear with no wheezes.  HEART: Regular rate and rhythm.  ABDOMEN: Soft, nontender, normal bowel sounds.  VITAL SIGNS:  Blood pressure on admission 145/80, respirations 18, pulse 83, temperature 98.   ASSESSMENT:  A 59 year old woman presented with agitated and self-injurious behavior which now she is reporting not having any memory of. Currently behaving calmly. She does have a past history of impulsivity and depression. The patient may have an adjustment disorder type presentation. I also am suspicious that part of her behavior may have been the result of the Fioricet, Flexeril and Ativan that she is prescribed. She told me that because of her headache that has been going on for several days she had been taking quite a bit of Fioricet. It did not seem to have occurred to her that it was not fixing the headache.   TREATMENT PLAN:  The patient has been admitted to psychiatry. Continue on current medications, but without the abusable ones. Engage her in daily group and individual therapy. Psychoeducation done. Monitor mood and behavior and suicidality. Work on establishing appropriate need for treatment.   DIAGNOSIS, PRINCIPAL AND PRIMARY:  AXIS I: Depression, not otherwise specified.   SECONDARY DIAGNOSES: AXIS I: Rule out barbiturate overuse abuse.  AXIS II: Deferred.  AXIS III: Chronic headaches, chronic gastric reflux, chronic dyslipidemia.  AXIS IV: Moderate to severe from chronic fighting with her family.  AXIS V: Functioning at time of admission 37.    ____________________________ Gonzella Lex, MD jtc:si D: 05/15/2012 19:33:00 ET T: 05/15/2012 20:26:45 ET JOB#: [id number]  cc: Gonzella Lex, MD, <Dictator> Gonzella Lex MD ELECTRONICALLY SIGNED 05/17/2012 11:03

## 2014-09-05 NOTE — H&P (Signed)
PATIENT NAME:  Glenda [name], Glenda [name] MR#:  [id number] DATE OF BIRTH:  06-15-1955  DATE OF ADMISSION:  05/07/2013  REFERRING PHYSICIAN: Marjean Donna, MD  PRIMARY CARE PHYSICIAN: Lorelee Market, MD  PRIMARY CARDIOLOGIST: Lujean Amel, MD   CHIEF COMPLAINT: Chest pain and incidental finding of lung nodules.  HISTORY OF PRESENT ILLNESS: This is a 59 year old female with significant past medical history of anxiety, hyperlipidemia, and depression who presents with complaints of chest pain, reports the chest pain is right-sided, describes it as tightness, has been there for a few hours, resolved on its own. Denies any nausea, vomiting, diaphoresis, dyspnea, shortness of breath, palpitations, hemoptysis, dizziness, or weakness. The patient had a mildly elevated d-dimer at 0.53, so she had CT angiogram of chest to rule out PE. Her CT chest came back negative for PE, but it did show right lower lung nodules. As well, the patient mentions she had 30 pound loss over the period of the last 4 months. Reports she had recent EGD done. She is scheduled to have colonoscopy done in February at [facility] was requested to admit the patient for further evaluation.   PAST MEDICAL HISTORY: 1. Anxiety. 2.  Depression. 3.  Hyperlipidemia. 4.  Migraine headache. 5.  Chronic pain syndrome.  ALLERGIES: DOXYCYCLINE, MORPHINE.   SOCIAL HISTORY: The patient does smoke 1/2 pack to 1 pack a day. She was counseled about quitting smoking, but currently she does not seem interested and she does not want any nicotine patches. No alcohol. No drug abuse. Lives at home.   FAMILY HISTORY: Significant for cancer on her mother's side and coronary artery disease in the family.  HOME MEDICATIONS: 1.  Percocet 5/325 mg 1 tablet every 6 hours.  2.  OxyContin 20 mg extended release 1 tablet every 12 hours.  3.  Tylenol extra-strength 2 tablets every 6 hours as needed.  4.  Clonazepam 1 mg 2 times a day.   5.  Paxil 40 mg oral daily.  6.  Meclizine 25 mg oral 3 times a day as needed.  7.  Pravachol 40 mg oral at bedtime.  8.  Norvasc 5 mg oral daily.  9.  Linzess 145 mcg oral daily.  10.  Flexeril 10 mg oral 3 times a day as needed.  11.  Premarin 0.3 mg oral 1/2 tablet daily.  12.  Dexilant 60 mg delayed release oral daily. 13.  Artificial Tears in both eyes as needed 2 times a day.   REVIEW OF SYSTEMS: CONSTITUTIONAL: The patient denies fever, chills. Complains of fatigue, weakness. Reports 30 pound weight loss. EYES: Denies blurry vision, double vision, inflammation, and glaucoma.  ENT: Denies tinnitus, ear pain, hearing loss, epistaxis or discharge.  RESPIRATORY: Denies cough, wheezing, hemoptysis, dyspnea, or COPD. CARDIOVASCULAR: Reports chest tightness, currently resolved. Denies any edema, arrhythmia, palpitations, or syncope.  GASTROINTESTINAL: Denies nausea, vomiting, diarrhea, abdominal pain, hematemesis, or melena.  GENITOURINARY: Denies dysuria, hematuria, or renal colic.  ENDOCRINE: Denies polyuria, polydipsia, heat or cold intolerance.  HEMATOLOGY: Denies anemia, easy bruising, or bleeding diathesis.  INTEGUMENT: Denies acne, rash, or skin lesion.  MUSCULOSKELETAL: Denies any redness, gout, or cramps.  NEUROLOGIC: Denies CVA, TIA, seizures, headache, dementia,  PSYCHIATRIC: Denies insomnia, schizophrenia. Reports history of anxiety and depression.   PHYSICAL EXAMINATION: VITAL SIGNS: Temperature 97.7, pulse 73, respiratory rate 14, blood pressure 96/51, saturating 96% on room air.  GENERAL: Well-nourished female who looks comfortable in bed, in no apparent distress.  HEENT: Head atraumatic, normocephalic. Pupils equal  and reactive to light. Pink conjunctivae. Anicteric sclerae. Moist oral mucosa.  NECK: Supple. No thyromegaly. No JVD.  CHEST: Good air entry bilaterally. No wheezing, rales, or rhonchi.  CARDIOVASCULAR: S1 and S2 heard. No rubs, murmurs, or gallops.   ABDOMEN: Soft, nontender, nondistended. Bowel sounds present.  EXTREMITIES: No edema. No clubbing. No cyanosis. Pedal pulses +2 bilaterally.  PSYCHIATRIC: Appropriate affect. Awake and alert x3. Intact judgment and insight.  NEUROLOGIC: Cranial nerves grossly intact. Motor strength 5/5. No focal deficits.  LYMPHATIC: No cervical lymphadenopathy could be appreciated.  SKIN: Warm and dry. Normal skin turgor.  MUSCULOSKELETAL: No joint effusion or erythema.   PERTINENT LABORATORY AND DIAGNOSTICS: Glucose 93. BNP 69. BUN 4, creatinine 1, sodium 137, potassium 3.7, chloride 104, and CO2 30. Troponin less than 0.02 x2. Drug of abuse positive for opiates and tricyclic antidepressant. White blood cells 8.2, hemoglobin 13.9, hematocrit 42.8, and platelets 254. D-dimer 0.53.  CT chest, PE protocol, showing no evidence of pulmonary embolism. Multiple small nodules in the periphery of the right lower lung lobe measuring up to 8 mm in size. This raises suspicion for underlying atypical infection process such as fungi infection. Mild bilateral atelectasis seen.   EKG showing normal sinus rhythm at 91 beats per minute, showing T wave inversion in V3, 4 and 5 which is old once compared to previous EKG.   ASSESSMENT AND PLAN: 1.  Incidental finding of lung nodules on CT chest. Especially with the patient's report of weight loss and her history of heavy smoking, we will consult pulmonary service for further evaluation to see if there is any further workup that is indicated at this point. As well the patient reports she has colonoscopy scheduled as an outpatient in February. The patient was instructed to follow up with her PCP and even try to see if she can have it done sooner.  2.  Chest pain. Appears to be noncardiac. The patient has no acute EKG changes and negative troponins x2. We will cycle cardiac enzymes x3, and the patient has received aspirin by EMS.  3.  Tobacco abuse. The patient was counseled. Does not  seem to be interested to quit smoking. Currently does not want any nicotine patches.  4.  Anxiety and depression. Continue with home medication.  5.  Chronic pain syndrome. Continue with home meds.  6.  Hyperlipidemia. Continue with Pravachol. 7.  Hypertension. Blood pressure actually is on the lower side so will hold her Norvasc.  8.  Deep vein thrombosis prophylaxis. Subcutaneous heparin. 9.  Gastrointestinal prophylaxis. The patient is on proton pump inhibitor.   CODE STATUS: FULL code.   TOTAL TIME SPENT ON ADMISSION AND PATIENT CARE: 50 minutes.  ____________________________ Albertine Patricia, MD dse:sb D: 05/07/2013 06:46:40 ET T: 05/07/2013 07:08:51 ET JOB#: [id number]  cc: Albertine Patricia, MD, <Dictator> DAWOOD Graciela Husbands MD ELECTRONICALLY SIGNED 05/08/2013 5:46

## 2014-09-06 NOTE — Discharge Summary (Signed)
PATIENT NAME:  Glenda [name], LANDERS MR#:  [id number] DATE OF BIRTH:  09/24/1955  DATE OF ADMISSION:  05/07/2013 DATE OF DISCHARGE:  05/07/2013  CONSULTANT: Dr. Mortimer Fries from pulmonary.   PRIMARY CARE PHYSICIAN: Dr. Brunetta Genera.   CHIEF COMPLAINT: Chest pain with incidental finding of lung nodule.   DISCHARGE DIAGNOSES: 1.  Lung nodules, possible atypical pneumonia.  2.  Chest pain, likely not cardiac.  3.  Ongoing tobacco abuse.  4.  Anxiety.  5.  Depression.  6.  Chronic pain syndrome.  7.  Hyperlipidemia.  8.  Hypertension.   DISCHARGE MEDICATIONS: Levaquin 750 mg p.o. daily for 5 days, Zofran p.r.n., Premarin 0.3 mg 1/2 tab once a day, meclizine 25 mg 3 times a day as needed for dizziness, clonazepam 1 mg 2 times a day, cyclobenzaprine 10 mg 3 times a day as needed for migraines, artificial tears 2 drops to each eye once a day as needed for dry eyes, Paxil 40 mg daily, Pravachol 40 mg daily, Dexilant 60 mg daily, Linzess 145 mcg daily, amlodipine 5 mg daily, OxyContin 20 mg extended release 1 tab every 12 hours, promethazine 25 mg every 6 hours, acetaminophen/oxycodone 325/5 mg 1 tab every 6 hours as needed for pain.   DIET: Low sodium.   ACTIVITY: As tolerated.   Please follow with PCP in 1 to 2 weeks. Please follow with Dr. Humphrey Rolls from pulmonary on 05/28/2013, at 2:30 p.m., and get referred by your PCP to recheck a CT of the chest in 4 to 6 weeks.   SIGNIFICANT LABS AND IMAGING: Troponins is negative x 3.  U-tox positive for opiates and TCA. White count of 8.2, hemoglobin 13.9, platelets 254. D-dimer 0.53. CT of the chest PA protocol with angio showed no evidence of pulmonary embolus. Multiple small nodules in the periphery of the right lower lobe measuring up to 8 mm, raises suspicion for underlying atypical infectious process, and 2.7 cm left adrenal adenoma noted.   HISTORY OF PRESENT ILLNESS AND HOSPITAL COURSE: For full details of H and P, please see the dictation on December 23 by Dr.  Waldron Labs, but briefly this is a 59 year old with anxiety, hyperlipidemia, depression, chronic pain, who came in for right-sided chest pain described as tightness. The patient was also noted to have a mildly elevated d-dimer, and she had a CT angio of the chest to rule out PE, which did not show any PE, but had some small nodules in the lung and given mention of weight loss, was admitted to the hospitalist service for evaluation by pulmonary. Of note, she was also to have a colonoscopy done in February at [facility]. She was admitted to the hospitalist service for observation. She was ruled out for acute coronary syndrome with cyclic cardiac markers, and currently she is chest pain-free. In regards to the lung nodules, she was seen by Dr. Mortimer Fries, whom I discussed the case with. It is possible these are atypical pneumonia, but it is also possible that these might be mucus, per Dr. Mortimer Fries. We have arranged for a followup with pulmonary with Dr. Humphrey Rolls as an outpatient. Per Dr. Mortimer Fries, treat for atypical pneumonia for 5 days with Levaquin, and repeat another CAT scan in 4 to 6 weeks, and to follow up and undergo the colonoscopy as scheduled. The weight loss should be investigated. She does smoke tobacco and was counseled for 3 minutes by me to stop smoking, but I doubt she is going to stop smoking. She has been ruled out for acute  coronary syndrome and her chest pain is atypical and likely not cardiac related.   PHYSICAL EXAMINATION:  VITAL SIGNS:  On the day of discharge, temperature is 97.8, pulse rate 70, respiratory rate 16, blood pressure 103/51, O2 sat 98% on room air.  GENERAL: The patient is a well-developed female lying in bed, no obvious distress.  HEENT: Normocephalic, atraumatic. Moist mucous membranes.  NECK: Supple.  CARDIOVASCULAR: S1, S2. Regular rate and rhythm.  LUNGS: Clear to auscultation without wheezing, rhonchi or rales.  ABDOMEN: Soft, nontender.  EXTREMITIES: No pitting edema.   Total  Time Spent: 35 minutes on doing this discharge.    ____________________________ Vivien Presto, MD sa:dmm D: 05/07/2013 12:47:00 ET T: 05/07/2013 13:09:42 ET JOB#: [id number]  cc: Vivien Presto, MD, <Dictator> Allyne Gee, MD Meindert A. Brunetta Genera, MD Vivien Presto MD ELECTRONICALLY SIGNED 05/28/2013 11:09

## 2014-09-07 NOTE — Discharge Summary (Signed)
PATIENT NAME:  Glenda [name], Glenda [name] MR#:  [id number] DATE OF BIRTH:  17-Apr-1956  DATE OF ADMISSION:  06/24/2011 DATE OF DISCHARGE:  06/28/2011  HOSPITAL COURSE: See dictated History and Physical for details of admission. This 59 year old woman with a history of depression was admitted after taking an overdose. The patient was reporting that she had chronic stress, worsening depression in part related to the recent death of her father. She admitted that she had taken an excessive amount of her medication. Initially she claimed to me that this was not intentional but later admitted that she had probably done it intentionally with at least some degree of suicidal ideation. In the hospital, she did not engage in any dangerous or suicidal behavior. She did not make any suicidal threats. She was cooperative with treatment. She attended groups and appeared to benefit from them. She was continued on psychiatric medicine, primarily her paroxetine which she had taken previously. Some medication was added p.r.n. to help with sleep. Over the course of several days, the patient's mood and affect improved. By the time of discharge, she was reporting that her mood was much better. Her affect was brighter. She reported that she felt much more capable to cope with her outpatient stresses. She was agreeable to follow-up treatment to be arranged in the community.   DISCHARGE MEDICATIONS:  1. Trazodone 100 mg at night as needed for sleep.  2. Pravachol 40 mg at bedtime.  3. Paxil 40 mg per day.  4. Zofran 4 mg every 6 hours p.r.n. for nausea.  5. Prilosec 20 mg b.i.d.   6. Hydroxyzine 50 mg every 6 hours p.r.n. for anxiety.   LABORATORY RESULTS: A CT scan of the cervical spine was done in the Emergency Room because of her recent fall. It was unremarkable. CK was normal. Drug screen was positive for barbiturates. Urinalysis was unremarkable. Alcohol level was negative. Chemistry panel was unremarkable. CBC was unremarkable. EKG  was normal.   MENTAL STATUS EXAM AT DISCHARGE: A neatly dressed and groomed woman who looks her stated age. Good eye contact, normal psychomotor activity. Speech appropriate. Affect euthymic, appropriate, and calm. Mood stated as good. Thoughts are lucid and directed. No evidence of loosening of associations or delusional thinking. Denies hallucinations. Denies suicidal or homicidal ideation. Shows improved judgment and insight.  DISPOSITION: Discharge back home with her family. Arrange follow-up in the community either with Dr. Kasandra Knudsen or with another private practitioner.   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   SECONDARY DIAGNOSES:  AXIS I: No further.   AXIS II: Deferred.   AXIS III: Some gastric reflux symptoms, elevated cholesterol.   AXIS IV: Recent death of her father.   AXIS V: Functioning at the time of discharge 57.   ____________________________ Gonzella Lex, MD jtc:cbb D: 07/11/2011 14:13:48 ET T: 07/11/2011 14:43:08 ET JOB#: [id number]  cc: Gonzella Lex, MD, <Dictator> Gonzella Lex MD ELECTRONICALLY SIGNED 07/11/2011 14:58

## 2014-09-07 NOTE — Consult Note (Signed)
Psychiatry: Consult for entered as "urgent" last night, but I was NOT AWARE of it until this morning and furthermore there was NO doctor to doctor contact. Therefore consult is being done on a routine basis.  Electronic Signatures: Gonzella Lex (MD)  (Signed on 08-Feb-13 11:55)  Authored  Last Updated: 08-Feb-13 11:55 by Gonzella Lex (MD)

## 2014-09-07 NOTE — Consult Note (Signed)
Brief Consult Note: Diagnosis: depression nos.   Patient was seen by consultant.   Recommend further assessment or treatment.   Orders entered.   Comments: Psychiatry: Patient seen. Also spoke with H and reviewed chart and labs. Depression with concern about suicidal behavior. 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 Admit to [facility] when bed avilible.  Electronic Signatures: Gonzella Lex (MD)  (Signed 08-Feb-13 12:24)  Authored: Brief Consult Note   Last Updated: 08-Feb-13 12:24 by Gonzella Lex (MD)

## 2014-09-07 NOTE — H&P (Signed)
PATIENT NAME:  Glenda [name], HODGKISS MR#:  [id number] DATE OF BIRTH:  1955-06-18  DATE OF ADMISSION:  06/24/2011  IDENTIFYING INFORMATION AND CHIEF COMPLAINT: This is a 59 year old woman brought into the Emergency Room under involuntary petition because of alleged overdose.   CHIEF COMPLAINT: "I've been under a lot of stress."   HISTORY OF PRESENT ILLNESS: Information obtained from the patient and also from conversation with the patient's husband and review of the paperwork including the commitment petition from her son. Patient states that after her father passed away three months ago by suicide she has felt like she has been under a lot of stress. Mood does stay down and depressed a fair bit of the time. Has some trouble sleeping at night. She denies that she has had some suicidal ideation. Does feel tired a lot. Has a lot of somatic complaints specifically complains of daily headaches. The patient states that yesterday she was standing in the shower when she fell down on top of the commode. Her son was there and helped her. The paperwork from her son reports that she had called him and said she had taken an overdose of medicine intentionally and made some statements that implied suicidal ideation. The patient denies this. I spoke with the husband and he says that he thinks that she chronically overtake some of her medicine and that it affects her mood negatively and that she has been increasingly depressed. He thinks that she may have intentionally overdosed but he is unsure as to how much she really intended to harm herself.   PAST PSYCHIATRIC HISTORY: Has been diagnosed with depression in the past. Has had at least two psychiatric hospitalizations in the past, the details of those are vague. The patient claims that she has never tried to kill herself. She is currently taking Paxil 40 mg a day and Ativan 1 mg 4 times a day as needed. She is not currently seeing a psychiatrist. Last saw Dr. Kasandra Knudsen until he moved  out of town but has not been seeing anyone but her primary care doctor since then. She also tells me that at one point she had been prescribed Haldol but she claims that this was for headaches. I am not sure of that.   SOCIAL HISTORY: Patient lives with her husband. The patient's father was depressed and killed himself several months ago. Patient has an adult son who filed the commitment petition.   PAST MEDICAL HISTORY:  1. Elevated cholesterol. 2. Gastric reflux symptoms. 3. Daily headaches. She says that she was diagnosed with pseudotumor cerebri and had surgery for it in the past and has had headaches ever since. Evidently right now the only thing she is prescribed for it is Fioricet which she takes every four hours as needed for pain which often results in quite a few of them over the course of the day and possibly also Flexeril according to her husband.   CURRENT MEDICATIONS:  1. Paxil 40 mg a day.  2. Ativan 1 mg 4 times a day as needed for anxiety.  3. Pravastatin 40 mg at night.  4. Prilosec 20 mg twice a day.  5. Fioricet tablets, one of them every four hours p.r.n. pain. 6. Zofran 4 mg p.r.n. nausea.  7. Flexeril p.r.n. during the day, unclear how many of these she takes.   ALLERGIES: Morphine.   FAMILY HISTORY: As noted above her father killed himself recently.   SUBSTANCE ABUSE HISTORY: Patient states that she drinks only infrequently. She  claims that she does not abuse her medicine. Denies use of any other drugs. Family is afraid that she is perhaps unintentionally but nevertheless doing it, overusing some of her prescription medicine.   REVIEW OF SYSTEMS: Complains of headache. Complains of fatigue. Complains of somewhat depressed mood. Some difficulty sleeping at night. Denies suicidal ideation. Denied psychotic symptoms. A little bit nauseous. A little bit stiff in her back right now.   MENTAL STATUS EXAM: Slightly disheveled woman interviewed in the Emergency Room. Alert  and awake. Cooperative with the interview. Good eye contact. Psychomotor activity a little depressed. Speech is slow, but easy to understand. Thoughts appear slow and simple but no evidence of loosening of associations or delusions or psychosis. Affect is a little bit blunted, not tearful. Mood is stated as being all right. Patient denies any suicidal or homicidal ideation. Judgment and insight seem impaired.   PHYSICAL EXAMINATION:  GENERAL: Patient did not appear to be in acute distress. She was able to stand up and walk around the room with a somewhat normal gait.   VITAL SIGNS: Pulse 75, respirations 18, blood pressure 107/56, no temperature record.   SKIN: No acute skin lesions identified.   HEENT: Pupils equal and reactive and cranial nerves all intact.   MUSCULOSKELETAL: Strength is symmetric throughout as are reflexes. Gait within normal limits.  NECK: Nontender as is back.    EXTREMITIES: Normal range of motion at all extremities.   LUNGS: Clear to auscultation without extra sounds.   HEART: Regular rate and rhythm.   ABDOMEN: Soft, nontender, normal bowel sounds.   ASSESSMENT: This is a 59 year old woman with questionable overdose of prescription medicine. Because it includes barbiturates and because of the concern on the part of her family as well as the fact that she does not have outpatient treatment reliably set up right now I think that the safest thing to do is to admit her to the hospital out of concern of dangerousness.   TREATMENT PLAN: I am going to stop the Fioricet and taper her off the Ativan. Continue the Paxil and Prilosec and pravastatin. Continue the Zofran but discontinue the Flexeril as also a benzodiazepine-like abusable medicine. Ibuprofen p.r.n. as well as trazodone at night p.r.n. and Vistaril p.r.n. for anxiety. Engage in groups and activities. Observe on the unit. Hope that family visits. Work on discharge planning and referral to outpatient treatment.    DIAGNOSES PRINCIPLE AND PRIMARY:  AXIS I: Major depressive episode, moderate, recurrent.   SECONDARY DIAGNOSES:  AXIS I: Rule out barbiturate and benzodiazepine dependence.   AXIS III: Chronic headaches.   AXIS IV: Severe-Recent stress from her father's death.   AXIS V: Functioning at time of admission 30.  ____________________________ Gonzella Lex, MD jtc:cms D: 06/24/2011 12:36:00 ET T: 06/24/2011 12:55:18 ET JOB#: [id number]  cc: Gonzella Lex, MD, <Dictator> Gonzella Lex MD ELECTRONICALLY SIGNED 06/24/2011 15:37

## 2014-09-08 LAB — SURGICAL PATHOLOGY

## 2014-09-30 ENCOUNTER — Ambulatory Visit (HOSPITAL_COMMUNITY): Payer: Medicaid - Dental | Admitting: Dentistry

## 2014-09-30 ENCOUNTER — Encounter (HOSPITAL_COMMUNITY): Payer: Self-pay | Admitting: Dentistry

## 2014-09-30 VITALS — BP 121/69 | HR 64 | Temp 98.3°F

## 2014-09-30 DIAGNOSIS — K0820 Unspecified atrophy of edentulous alveolar ridge: Secondary | ICD-10-CM

## 2014-09-30 DIAGNOSIS — Z463 Encounter for fitting and adjustment of dental prosthetic device: Secondary | ICD-10-CM

## 2014-09-30 DIAGNOSIS — Z972 Presence of dental prosthetic device (complete) (partial): Secondary | ICD-10-CM

## 2014-09-30 DIAGNOSIS — K117 Disturbances of salivary secretion: Secondary | ICD-10-CM

## 2014-09-30 DIAGNOSIS — K08109 Complete loss of teeth, unspecified cause, unspecified class: Secondary | ICD-10-CM

## 2014-09-30 DIAGNOSIS — K000 Anodontia: Secondary | ICD-10-CM

## 2014-09-30 DIAGNOSIS — R682 Dry mouth, unspecified: Secondary | ICD-10-CM

## 2014-09-30 NOTE — Progress Notes (Signed)
09/30/2014  Patient:            Glenda [name] Date of Birth:  16-Feb-1956 MRN:                [id number]   BP 121/69 mmHg  Pulse 64  Temp(Src) 98.3 F (36.8 C) (Oral)   Past Medical History  Diagnosis Date  . Chronic headaches     Migraines  . Anxiety state, unspecified   . Depressive disorder, not elsewhere classified   . Esophageal reflux   . Cervicalgia   . Abdominal pain, unspecified site     occasional  . Other and unspecified hyperlipidemia   . Chronic airway obstruction, not elsewhere classified   . Gastroparesis   . Osteoporosis   . PONV (postoperative nausea and vomiting)   . Unspecified essential hypertension     no meds currently for HBP - has been on BP meds in past  . Lung infection Dec 2014  . Difficulty sleeping   . Dizziness   . Chronic periodontitis   . Swelling of ankle     bilateral   Past Surgical History  Procedure Laterality Date  . Total abdominal hysterectomy  1995    [facility]  . Laparoscopic cholecystectomy  06/2013    [facility]  . Chiari malformation surgery  2009    [place]  . Multiple extractions with alveoloplasty N/A 11/21/2013    Procedure: Extraction of tooth #'s 2,6,7,8,9,10,11,21,22,23,24,25,26,27,28,29, 32 with alveoloplasty and bilatreal mandibular tori reductions;  Surgeon: Lenn Cal, DDS;  Location: [facility];  Service: Oral Surgery;  Laterality: N/A;    Allergies  Allergen Reactions  . Doxycycline Itching  . Fentanyl Other (See Comments)    "made me hyper"  . Morphine And Related Other (See Comments)    "chest pain"    Current Outpatient Prescriptions  Medication Sig Dispense Refill  . acetaminophen (TYLENOL) 500 MG tablet Take 1,000 mg by mouth every 6 (six) hours as needed for mild pain or moderate pain.    . clonazePAM (KLONOPIN) 1 MG tablet Take 1 mg by mouth 2 (two) times daily as needed for anxiety.    . cyclobenzaprine (FLEXERIL) 10 MG tablet Take 10 mg by mouth 3 (three) times daily as needed for muscle spasms.    Marland Kitchen  dexlansoprazole (DEXILANT) 60 MG capsule Take 60 mg by mouth daily.    Marland Kitchen estrogens, conjugated, (PREMARIN) 0.3 MG tablet Take 0.3 mg by mouth daily.     . meclizine (ANTIVERT) 25 MG tablet Take 25 mg by mouth 3 (three) times daily as needed for dizziness.    . ondansetron (ZOFRAN) 4 MG tablet Take 4 mg by mouth 3 (three) times daily as needed for nausea or vomiting.    . OxyCODONE (OXYCONTIN) 20 mg T12A 12 hr tablet Take 20 mg by mouth every 12 (twelve) hours.    Marland Kitchen oxyCODONE-acetaminophen (PERCOCET) 10-325 MG per tablet Take 1 tablet by mouth every 6 (six) hours as needed for pain.    Marland Kitchen PARoxetine (PAXIL) 40 MG tablet Take 40 mg by mouth every morning.    . tiotropium (SPIRIVA) 18 MCG inhalation capsule Place 18 mcg into inhaler and inhale daily.     No current facility-administered medications for this visit.    C/C: Patient presents for evaluation of dentures.   HPI:  Glenda [name] is a 59 year old female with multiple medical problems. Patient had remaining teeth extracted with alveoloplasty and pre-prosthetic surgery as needed on 11/21/2013 in the OR . Upper  and  lower complete dentures were fabricated and inserted on 01/30/2014. Patient was seen for denture adjustment on 02/02/2014 and then patient had multiple broken appointments for evaluation of dentures with denture adjustment as needed. Patient now presents for periodic oral examination and evaluation of upper lower complete dentures.  DENTAL EXAM: General: The patient is a well-developed, well-nourished female in no acute distress.  Vitals: BP 121/69 mmHg  Pulse 64  Temp(Src) 98.3 F (36.8 C) (Oral) Extraoral Exam: There is no palpable submandibular lymphadenopathy. The patient denies acute TMJ symptoms. Intraoral  Exam: The patient has incipient xerostomia. There is no evidence of soft tissue irritation or denture irritation. Patient has a generalized erythema most likely secondary to smoking one half to one pack per day.  There is atrophy of the edentulous alveolar ridges. Dentition: The patient is edentulous. Prosthodontic: The patient has an upper lower complete denture. The dentures are stable and retentive. The patient has less than ideal retention, however, secondary to atrophy of the edentulous alveolar ridges. Pressure indicating paste was applied to the upper lower complete dentures. The upper denture needed minimal adjustment. The lower denture required more extensive denture adjustment. Thick pressure indicating paste was applied to the lower denture borders. Denture borders were adjusted as needed. Dentures were polished. Occlusion:  The occlusion was evaluated and adjustments made for centric relation and protrusive strokes. Patient with difficulty finding centric relation position. Patient was instructed on finding maximum intercuspation position and did finally find that position near the end of the procedure.  Assessments: 1. Edentulous 2. Atrophy of the edentulous alveolar ridges 3. Incipient xerostomia 4. Stable and retentive upper and lower complete denture.  Plan:  1.  Patient is to wear upper and lower complete dentures and then contact dental medicine if she desires upper and lower complete denture reline procedures. I will need to obtain prior approval from [facility] prior to that appointment. Patient is aware that relines are only available once every 5 years. Patient initially felt that she would wait on relines at this time. 2. Keep dentures out if sore spots arise. 3. Use salt water rinses as needed to aid healing. 4. Return to clinic as scheduled or call a problems arise before then. 5. The patient is to discuss smoking cessation with her primary physician.  Lenn Cal, DDS

## 2014-09-30 NOTE — Patient Instructions (Signed)
Keep dentures out if sore spots arise. Use salt water rinses as needed to aid healing. Return to clinic for denture adjustment as scheduled or call if problems arise before then. Dr. Enrique Sack

## 2014-10-02 DIAGNOSIS — F339 Major depressive disorder, recurrent, unspecified: Secondary | ICD-10-CM | POA: Diagnosis not present

## 2014-10-02 DIAGNOSIS — F17210 Nicotine dependence, cigarettes, uncomplicated: Secondary | ICD-10-CM | POA: Diagnosis not present

## 2014-10-02 DIAGNOSIS — F411 Generalized anxiety disorder: Secondary | ICD-10-CM | POA: Diagnosis not present

## 2014-10-02 DIAGNOSIS — G43019 Migraine without aura, intractable, without status migrainosus: Secondary | ICD-10-CM | POA: Diagnosis not present

## 2014-10-06 ENCOUNTER — Other Ambulatory Visit: Payer: Self-pay | Admitting: Physician Assistant

## 2014-10-06 DIAGNOSIS — H539 Unspecified visual disturbance: Secondary | ICD-10-CM

## 2014-10-06 DIAGNOSIS — R51 Headache: Principal | ICD-10-CM

## 2014-10-06 DIAGNOSIS — R42 Dizziness and giddiness: Secondary | ICD-10-CM

## 2014-10-06 DIAGNOSIS — R519 Headache, unspecified: Secondary | ICD-10-CM

## 2014-10-10 ENCOUNTER — Ambulatory Visit
Admission: RE | Admit: 2014-10-10 | Discharge: 2014-10-10 | Disposition: A | Discharge status: Home / Self Care | Payer: Medicare Other | Source: Ambulatory Visit | Attending: Physician Assistant | Admitting: Physician Assistant

## 2014-10-10 DIAGNOSIS — H539 Unspecified visual disturbance: Secondary | ICD-10-CM | POA: Diagnosis not present

## 2014-10-10 DIAGNOSIS — R51 Headache: Secondary | ICD-10-CM | POA: Diagnosis not present

## 2014-10-10 DIAGNOSIS — Z885 Allergy status to narcotic agent status: Secondary | ICD-10-CM | POA: Insufficient documentation

## 2014-10-10 DIAGNOSIS — Z888 Allergy status to other drugs, medicaments and biological substances status: Secondary | ICD-10-CM | POA: Insufficient documentation

## 2014-10-10 DIAGNOSIS — R519 Headache, unspecified: Secondary | ICD-10-CM

## 2014-10-10 DIAGNOSIS — R42 Dizziness and giddiness: Secondary | ICD-10-CM

## 2014-10-14 DIAGNOSIS — H269 Unspecified cataract: Secondary | ICD-10-CM | POA: Diagnosis not present

## 2014-10-17 DIAGNOSIS — G44221 Chronic tension-type headache, intractable: Secondary | ICD-10-CM | POA: Diagnosis not present

## 2014-10-17 DIAGNOSIS — Z720 Tobacco use: Secondary | ICD-10-CM | POA: Diagnosis not present

## 2014-10-17 DIAGNOSIS — G894 Chronic pain syndrome: Secondary | ICD-10-CM | POA: Diagnosis not present

## 2014-10-17 DIAGNOSIS — F17200 Nicotine dependence, unspecified, uncomplicated: Secondary | ICD-10-CM | POA: Diagnosis not present

## 2014-10-17 DIAGNOSIS — G43011 Migraine without aura, intractable, with status migrainosus: Secondary | ICD-10-CM | POA: Diagnosis not present

## 2014-10-17 DIAGNOSIS — Z716 Tobacco abuse counseling: Secondary | ICD-10-CM | POA: Diagnosis not present

## 2014-10-17 DIAGNOSIS — Z79891 Long term (current) use of opiate analgesic: Secondary | ICD-10-CM | POA: Diagnosis not present

## 2014-11-13 DIAGNOSIS — G43011 Migraine without aura, intractable, with status migrainosus: Secondary | ICD-10-CM | POA: Diagnosis not present

## 2014-11-13 DIAGNOSIS — G44221 Chronic tension-type headache, intractable: Secondary | ICD-10-CM | POA: Diagnosis not present

## 2014-11-13 DIAGNOSIS — Z720 Tobacco use: Secondary | ICD-10-CM | POA: Diagnosis not present

## 2014-11-13 DIAGNOSIS — Z79891 Long term (current) use of opiate analgesic: Secondary | ICD-10-CM | POA: Diagnosis not present

## 2014-11-13 DIAGNOSIS — F17200 Nicotine dependence, unspecified, uncomplicated: Secondary | ICD-10-CM | POA: Diagnosis not present

## 2014-11-13 DIAGNOSIS — G894 Chronic pain syndrome: Secondary | ICD-10-CM | POA: Diagnosis not present

## 2014-11-13 DIAGNOSIS — Z716 Tobacco abuse counseling: Secondary | ICD-10-CM | POA: Diagnosis not present

## 2014-12-08 DIAGNOSIS — D242 Benign neoplasm of left breast: Secondary | ICD-10-CM | POA: Diagnosis not present

## 2014-12-08 DIAGNOSIS — G43019 Migraine without aura, intractable, without status migrainosus: Secondary | ICD-10-CM | POA: Diagnosis not present

## 2014-12-08 DIAGNOSIS — F411 Generalized anxiety disorder: Secondary | ICD-10-CM | POA: Diagnosis not present

## 2014-12-08 DIAGNOSIS — Z0001 Encounter for general adult medical examination with abnormal findings: Secondary | ICD-10-CM | POA: Diagnosis not present

## 2014-12-08 DIAGNOSIS — F339 Major depressive disorder, recurrent, unspecified: Secondary | ICD-10-CM | POA: Diagnosis not present

## 2014-12-08 DIAGNOSIS — F17210 Nicotine dependence, cigarettes, uncomplicated: Secondary | ICD-10-CM | POA: Diagnosis not present

## 2014-12-08 DIAGNOSIS — H6692 Otitis media, unspecified, left ear: Secondary | ICD-10-CM | POA: Diagnosis not present

## 2014-12-10 ENCOUNTER — Other Ambulatory Visit: Payer: Self-pay | Admitting: Surgery

## 2014-12-10 DIAGNOSIS — N63 Unspecified lump in unspecified breast: Secondary | ICD-10-CM

## 2015-01-07 ENCOUNTER — Ambulatory Visit
Admission: RE | Admit: 2015-01-07 | Discharge: 2015-01-07 | Disposition: A | Discharge status: Home / Self Care | Payer: Medicare Other | Source: Ambulatory Visit | Attending: Surgery | Admitting: Surgery

## 2015-01-07 ENCOUNTER — Other Ambulatory Visit: Payer: Self-pay | Admitting: Surgery

## 2015-01-07 DIAGNOSIS — N63 Unspecified lump in unspecified breast: Secondary | ICD-10-CM

## 2015-01-07 DIAGNOSIS — R928 Other abnormal and inconclusive findings on diagnostic imaging of breast: Secondary | ICD-10-CM | POA: Diagnosis not present

## 2015-01-07 DIAGNOSIS — Z09 Encounter for follow-up examination after completed treatment for conditions other than malignant neoplasm: Secondary | ICD-10-CM | POA: Diagnosis not present

## 2015-02-06 DIAGNOSIS — R251 Tremor, unspecified: Secondary | ICD-10-CM | POA: Diagnosis not present

## 2015-02-06 DIAGNOSIS — R51 Headache: Secondary | ICD-10-CM | POA: Diagnosis not present

## 2015-02-06 DIAGNOSIS — R5383 Other fatigue: Secondary | ICD-10-CM | POA: Diagnosis not present

## 2015-02-06 DIAGNOSIS — F17210 Nicotine dependence, cigarettes, uncomplicated: Secondary | ICD-10-CM | POA: Diagnosis not present

## 2015-02-06 DIAGNOSIS — F339 Major depressive disorder, recurrent, unspecified: Secondary | ICD-10-CM | POA: Diagnosis not present

## 2015-02-06 DIAGNOSIS — R42 Dizziness and giddiness: Secondary | ICD-10-CM | POA: Diagnosis not present

## 2015-02-12 ENCOUNTER — Other Ambulatory Visit: Payer: Self-pay | Admitting: Nurse Practitioner

## 2015-02-12 DIAGNOSIS — R251 Tremor, unspecified: Secondary | ICD-10-CM

## 2015-02-16 ENCOUNTER — Ambulatory Visit: Payer: Medicare Other

## 2015-02-18 ENCOUNTER — Ambulatory Visit
Admission: RE | Admit: 2015-02-18 | Discharge: 2015-02-18 | Disposition: A | Discharge status: Home / Self Care | Payer: Medicare Other | Source: Ambulatory Visit | Attending: Nurse Practitioner | Admitting: Nurse Practitioner

## 2015-02-18 DIAGNOSIS — R251 Tremor, unspecified: Secondary | ICD-10-CM

## 2015-02-18 DIAGNOSIS — G935 Compression of brain: Secondary | ICD-10-CM | POA: Diagnosis not present

## 2015-02-18 DIAGNOSIS — R51 Headache: Secondary | ICD-10-CM | POA: Insufficient documentation

## 2015-02-18 DIAGNOSIS — R42 Dizziness and giddiness: Secondary | ICD-10-CM | POA: Diagnosis not present

## 2015-02-18 DIAGNOSIS — Z9889 Other specified postprocedural states: Secondary | ICD-10-CM | POA: Diagnosis not present

## 2015-02-24 DIAGNOSIS — Z720 Tobacco use: Secondary | ICD-10-CM | POA: Diagnosis not present

## 2015-02-24 DIAGNOSIS — F17200 Nicotine dependence, unspecified, uncomplicated: Secondary | ICD-10-CM | POA: Diagnosis not present

## 2015-02-24 DIAGNOSIS — Z79891 Long term (current) use of opiate analgesic: Secondary | ICD-10-CM | POA: Diagnosis not present

## 2015-02-24 DIAGNOSIS — G894 Chronic pain syndrome: Secondary | ICD-10-CM | POA: Diagnosis not present

## 2015-02-24 DIAGNOSIS — G44221 Chronic tension-type headache, intractable: Secondary | ICD-10-CM | POA: Diagnosis not present

## 2015-02-24 DIAGNOSIS — Z716 Tobacco abuse counseling: Secondary | ICD-10-CM | POA: Diagnosis not present

## 2015-02-24 DIAGNOSIS — G43011 Migraine without aura, intractable, with status migrainosus: Secondary | ICD-10-CM | POA: Diagnosis not present

## 2015-02-25 DIAGNOSIS — H93292 Other abnormal auditory perceptions, left ear: Secondary | ICD-10-CM | POA: Diagnosis not present

## 2015-02-25 DIAGNOSIS — R51 Headache: Secondary | ICD-10-CM | POA: Diagnosis not present

## 2015-02-26 ENCOUNTER — Ambulatory Visit (INDEPENDENT_AMBULATORY_CARE_PROVIDER_SITE_OTHER): Payer: Medicare Other | Admitting: Neurology

## 2015-02-26 ENCOUNTER — Encounter: Payer: Self-pay | Admitting: Neurology

## 2015-02-26 VITALS — BP 126/79 | HR 69 | Ht 64.0 in | Wt 139.0 lb

## 2015-02-26 DIAGNOSIS — G8929 Other chronic pain: Secondary | ICD-10-CM | POA: Insufficient documentation

## 2015-02-26 DIAGNOSIS — R51 Headache: Secondary | ICD-10-CM | POA: Diagnosis not present

## 2015-02-26 DIAGNOSIS — M47812 Spondylosis without myelopathy or radiculopathy, cervical region: Secondary | ICD-10-CM | POA: Insufficient documentation

## 2015-02-26 DIAGNOSIS — R519 Headache, unspecified: Secondary | ICD-10-CM

## 2015-02-26 HISTORY — DX: Headache, unspecified: R51.9

## 2015-02-26 NOTE — Progress Notes (Signed)
Reason for visit: Headache  Referring physician: Dr. Catha Nottingham is a 59 y.o. female  History of present illness:  Ms. Lafrance is a 59 year old right-handed white female with a history of chronic daily headache. The patient has been having headaches off and on since she was 14 or 59 years old. The patient does not recall the last day she had without headache. The patient was seen through this office in 2011, she was having daily headaches at that point. The patient was told that she had Arnold-Chiari malformation, but she had been seen by Dr. Hal Neer from neurosurgery in [place] and was told that she did not have Arnold-Chiari, but she apparently underwent surgery through [facility] in 2009. Following surgery, her symptoms worsened. She was having severe headaches before surgery associated with neck stiffness. Following surgery, she apparently went to [facility] and was diagnosed with pseudotumor cerebri. She was told that the opening pressure was 37 cm of water, she was placed on Diamox which cause swelling, and she has been on Topamax which was started relatively recently, and her ophthalmologist told her to come off of it because it could result in elevated intraocular pressures. The patient does not have a history of glaucoma. The patient believes that her symptoms have worsened over the last month or so, and the patient has developed total body tremors that may come and go, they are not there all the time. The patient has had increased neck stiffness, pain coming up the back of the neck and around the head. The patient has increased pain when she extends the neck. She may have some nausea and vomiting, floaters in the vision, and she may have some weakness sensations in the hands. She denies issues controlling the bowels or the bladder, or difficulty with balance. She has been on gabapentin, Lyrica, and Cymbalta in the past without benefit. She was just recently taken off of  Flexeril, and switched to tizanidine. The patient has been given trigger point injections without benefit. She does not believe that her cervical spine has been evaluated in the past.  Past Medical History  Diagnosis Date  . Chronic headaches     Migraines  . Anxiety state, unspecified   . Depressive disorder, not elsewhere classified   . Esophageal reflux   . Cervicalgia   . Abdominal pain, unspecified site     occasional  . Other and unspecified hyperlipidemia   . Chronic airway obstruction, not elsewhere classified   . Gastroparesis   . Osteoporosis   . PONV (postoperative nausea and vomiting)   . Unspecified essential hypertension     no meds currently for HBP - has been on BP meds in past  . Lung infection Dec 2014  . Difficulty sleeping   . Dizziness   . Chronic periodontitis   . Swelling of ankle     bilateral  . Chiari malformation   . Pseudotumor cerebri   . Chronic headache 02/26/2015    Past Surgical History  Procedure Laterality Date  . Total abdominal hysterectomy  1995    [facility]  . Laparoscopic cholecystectomy  06/2013    [facility]  . Chiari malformation surgery  2009    [place]  . Multiple extractions with alveoloplasty N/A 11/21/2013    Procedure: Extraction of tooth #'s 2,6,7,8,9,10,11,21,22,23,24,25,26,27,28,29, 32 with alveoloplasty and bilatreal mandibular tori reductions;  Surgeon: Lenn Cal, DDS;  Location: [facility];  Service: Oral Surgery;  Laterality: N/A;  . Breast biopsy Left  05/2014    neg  . Breast biopsy Left 2005    neg  . Cholecystectomy      Family History  Problem Relation Age of Onset  . Diabetes Mother     type II  . Cancer Mother     uterine  . GER disease Mother   . Osteoarthritis Mother   . Breast cancer Mother 23  . GER disease Father   . Stroke Sister   . Cancer Maternal Grandmother   . Cancer Maternal Grandfather   . Heart disease Paternal Grandmother   . Heart disease Paternal Grandfather     Social history:   reports that she has been smoking Cigarettes.  She has a 40 pack-year smoking history. She has never used smokeless tobacco. She reports that she does not drink alcohol or use illicit drugs.  Medications:  Prior to Admission medications   Medication Sig Start Date End Date Taking? Authorizing Provider  acetaminophen (TYLENOL) 500 MG tablet Take 1,000 mg by mouth every 6 (six) hours as needed for mild pain or moderate pain.   Yes Historical Provider, MD  clonazePAM (KLONOPIN) 1 MG tablet Take 1 mg by mouth 3 (three) times daily as needed for anxiety.    Yes Historical Provider, MD  dexlansoprazole (DEXILANT) 60 MG capsule Take 60 mg by mouth daily.   Yes Historical Provider, MD  estrogens, conjugated, (PREMARIN) 0.3 MG tablet Take 0.3 mg by mouth daily.    Yes Historical Provider, MD  meclizine (ANTIVERT) 25 MG tablet Take 25 mg by mouth 3 (three) times daily as needed for dizziness.   Yes Historical Provider, MD  OxyCODONE (OXYCONTIN) 20 mg T12A 12 hr tablet Take 20 mg by mouth every 12 (twelve) hours.   Yes Historical Provider, MD  oxyCODONE-acetaminophen (PERCOCET) 10-325 MG per tablet Take 1 tablet by mouth every 6 (six) hours as needed for pain.   Yes Historical Provider, MD  PARoxetine (PAXIL) 40 MG tablet Take 40 mg by mouth every morning.   Yes Historical Provider, MD  promethazine (PHENERGAN) 25 MG tablet Take 25 mg by mouth every 6 (six) hours as needed for nausea or vomiting.   Yes Historical Provider, MD  tiZANidine (ZANAFLEX) 4 MG tablet Take 4 mg by mouth every 8 (eight) hours as needed for muscle spasms.   Yes Historical Provider, MD      Allergies  Allergen Reactions  . Doxycycline Itching  . Fentanyl Other (See Comments)    "made me hyper"  . Gabapentin     Patient states it "put her in another world"  . Lyrica [Pregabalin]     Patient states it "put her in another world"  . Morphine And Related Other (See Comments)    "chest pain"    ROS:  Out of a complete 14 system  review of symptoms, the patient complains only of the following symptoms, and all other reviewed systems are negative.  Weight loss, fatigue Swelling in the legs Blurred vision, double vision Headaches, weakness, dizziness, tremor Depression, anxiety, not enough sleep, decreased energy Insomnia  Blood pressure 126/79, pulse 69, height 5\' 4"  (1.626 m), weight 139 lb (63.05 kg).  Physical Exam  General: The patient is alert and cooperative at the time of the examination.  Eyes: Pupils are equal, round, and reactive to light. Discs are flat bilaterally. Good venous pulsations are seen in the discs bilaterally.  Neck: The neck is supple, no carotid bruits are noted.  Respiratory: The respiratory examination is clear.  Cardiovascular: The cardiovascular examination reveals a regular rate and rhythm, no obvious murmurs or rubs are noted.  Neuromuscular: The patient lacks about 20 of full lateral rotation of the cervical spine bilaterally.  Skin: Extremities are without significant edema.  Neurologic Exam  Mental status: The patient is alert and oriented x 3 at the time of the examination. The patient has apparent normal recent and remote memory, with an apparently normal attention span and concentration ability.  Cranial nerves: Facial symmetry is present. There is good sensation of the face to pinprick and soft touch bilaterally. The strength of the facial muscles and the muscles to head turning and shoulder shrug are normal bilaterally. Speech is well enunciated, no aphasia or dysarthria is noted. Extraocular movements are full. Visual fields are full. The tongue is midline, and the patient has symmetric elevation of the soft palate. No obvious hearing deficits are noted.  Motor: The motor testing reveals 5 over 5 strength of all 4 extremities. Good symmetric motor tone is noted throughout.  Sensory: Sensory testing is intact to pinprick, soft touch, vibration sensation, and position  sense on all 4 extremities. No evidence of extinction is noted.  Coordination: Cerebellar testing reveals good finger-nose-finger and heel-to-shin bilaterally.  Gait and station: Gait is normal. Tandem gait is normal. Romberg is negative. No drift is seen.  Reflexes: Deep tendon reflexes are symmetric and normal bilaterally. Toes are downgoing bilaterally.   MRI brain 10/10/14:  IMPRESSION: Unremarkable brain MR. No acute or focal intracranial abnormality.  With regard to surgery for previous Chiari, no residual tonsillar impaction or cervicomedullary compression. No hydromyelia or Hydrocephalus.  * MRI scan images were reviewed online. I agree with the written report.    Assessment/Plan:  1. 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 Cervical spondylosis  Upon review of the MRI of the brain, the patient does not appear to have any significant issues with Arnold-Chiari malformation, this likely has nothing to do with her symptoms. The patient has excellent venous pulsations in the discs on clinical examination, the patient does not have pseudotumor cerebri. There is no indication for a lumbar puncture. The patient is having a lot of tension and neuromuscular symptoms in the neck and shoulders, the patient likely has cervicogenic headache. The patient will be sent for MRI of the cervical spine, she will continue on tizanidine for now. She will follow-up in 4 months. The patient indicates that stretching exercises worsen the pain at this time.  Jill Alexanders MD 02/26/2015 6:46 PM  [facility] [address] [place], [place] [address]  Phone [phone] Fax [phone]

## 2015-02-26 NOTE — Patient Instructions (Signed)
Headache and Arthritis  If you have arthritis and headaches, it is possible the two problems are related. Some headaches can be caused by arthritis in your neck (cervicogenic headaches).   Pain medicine is another possible link between arthritis and headache. If you take a lot of over-the-counter medicines for arthritis pain, you may develop the type of headache that can happen when you stop taking your over-the-counter pain reliever or lower your dose too quickly (rebound headache).   WHAT TYPES OF ARTHRITIS CAN CAUSE A HEADACHE?  There are two types of arthritis, rheumatoid arthritis and osteoarthritis. Both types of arthritis can cause headaches.   · Rheumatoid arthritis (RA) is an autoimmune disease that causes inflammation of your joints. When you have RA, your body's defense system (immune system) attacks the joints of your body and causes inflammation. This can lead to deformity over time.  · Osteoarthritis (OA) is wear and tear caused by joint use over time. Osteoarthritis is not an inflammatory disease.  Both OA and RA can cause neck pain that is felt in the head. When the pain is felt in a different location than it originates, it is called radiating or referred pain. This pain is usually felt in the back of the head.   HOW ARE HEADACHES AND ARTHRITIS RELATED?  RA can affect any joint in the body, including the joints between the bones of the neck (cervical vertebrae). The neck joints most commonly affected by RA are the top two joints, between the first and second cervical vertebra. Inflammation in these joints may be felt as neck pain and head pain.  OA of the neck may be caused by gradual wear and tear or by a neck injury. Neck vertebrae may develop calcium deposits in the areas where muscle attach. Wear and tear of the vertebra may cause pressure on the nerves that leave the spinal cord. These changes can cause referred pain that may be felt as a headache.  HOW ARE HEADACHES ASSOCIATED WITH ARTHRITIS  DIAGNOSED?  · Your health care provider may diagnose headache caused by RA if you have inflammation of vertebrae in your neck. You may have:  ¨ Blood tests to measure how much inflammation you have.  ¨ Imaging studies of your neck (MRI) to check for inflammation of cervical vertebrae.  · Your health care provider may diagnose headache caused by OA if an X-ray shows:  ¨ Calcium deposits.  ¨ Bone spurs.  ¨ Narrowing of the space between neck vertebrae.  · Your health care provider may diagnose rebound headache if you have a history of using over-the-counter pain relievers frequently.  WHEN SHOULD I SEEK CARE FOR MY HEADACHES?  Call your health care provider if:  · You have more than three headaches per week.  · You take an over-the-counter pain reliever almost every day.  · Your headaches are getting worse and happening more often.  · You have headache with fever.  · You have headache with numbness, weakness, or dizziness.  · You have headache with nausea or vomiting.  WHAT ARE MY TREATMENT OPTIONS?  · If you have headache caused by RA, treatment may include:    Over-the-counter or prescription-strength anti-inflammatory medicines.    Disease-modifying antirheumatic drugs (DMARDs). These medicines slow or stop the progression of RA.  · If you have headache caused by OA, treatment may include:    Over-the-counter pain medicines.    Heat or massage.    Physical therapy.  · If you have rebound headaches:      They will usually go away within several days of stopping the medicine that caused them.    You may be able to gradually reduce the amount of medicines you take to prevent headache.    Ask your health care provider if you can take another type of medicine instead.     This information is not intended to replace advice given to you by your health care provider. Make sure you discuss any questions you have with your health care provider.     Document Released: 07/23/2003 Document Revised: 05/23/2014 Document Reviewed:  08/05/2013  Elsevier Interactive Patient Education ©2016 [employer]

## 2015-03-03 ENCOUNTER — Telehealth: Payer: Self-pay | Admitting: Neurology

## 2015-03-03 ENCOUNTER — Ambulatory Visit
Admission: RE | Admit: 2015-03-03 | Discharge: 2015-03-03 | Disposition: A | Discharge status: Home / Self Care | Payer: Medicare Other | Source: Ambulatory Visit | Attending: Neurology | Admitting: Neurology

## 2015-03-03 DIAGNOSIS — R51 Headache: Secondary | ICD-10-CM | POA: Diagnosis not present

## 2015-03-03 DIAGNOSIS — M50322 Other cervical disc degeneration at C5-C6 level: Secondary | ICD-10-CM | POA: Diagnosis not present

## 2015-03-03 DIAGNOSIS — M47812 Spondylosis without myelopathy or radiculopathy, cervical region: Secondary | ICD-10-CM | POA: Diagnosis not present

## 2015-03-03 DIAGNOSIS — R519 Headache, unspecified: Secondary | ICD-10-CM

## 2015-03-03 NOTE — Telephone Encounter (Signed)
I called patient. The MRI of the cervical spine does show some minimal spondylosis, nothing that should result in significant headache issues. The patient still may have some neuromuscular component to her headache. She will remain on tizanidine for now.   MRI cervical spine 03/03/2015:  IMPRESSION: Mild spondylosis of the cervical spine without central canal or foraminal stenosis.

## 2015-03-09 DIAGNOSIS — R251 Tremor, unspecified: Secondary | ICD-10-CM | POA: Diagnosis not present

## 2015-03-09 DIAGNOSIS — R5383 Other fatigue: Secondary | ICD-10-CM | POA: Diagnosis not present

## 2015-03-09 DIAGNOSIS — F339 Major depressive disorder, recurrent, unspecified: Secondary | ICD-10-CM | POA: Diagnosis not present

## 2015-03-09 DIAGNOSIS — D519 Vitamin B12 deficiency anemia, unspecified: Secondary | ICD-10-CM | POA: Diagnosis not present

## 2015-03-09 DIAGNOSIS — G43019 Migraine without aura, intractable, without status migrainosus: Secondary | ICD-10-CM | POA: Diagnosis not present

## 2015-03-09 DIAGNOSIS — Z23 Encounter for immunization: Secondary | ICD-10-CM | POA: Diagnosis not present

## 2015-04-07 ENCOUNTER — Encounter (HOSPITAL_COMMUNITY): Payer: Self-pay | Admitting: Dentistry

## 2015-04-27 ENCOUNTER — Encounter: Payer: Self-pay | Admitting: Gastroenterology

## 2015-04-27 DIAGNOSIS — D649 Anemia, unspecified: Secondary | ICD-10-CM | POA: Diagnosis not present

## 2015-04-27 DIAGNOSIS — E039 Hypothyroidism, unspecified: Secondary | ICD-10-CM | POA: Diagnosis not present

## 2015-05-04 DIAGNOSIS — F331 Major depressive disorder, recurrent, moderate: Secondary | ICD-10-CM | POA: Diagnosis not present

## 2015-05-04 DIAGNOSIS — D519 Vitamin B12 deficiency anemia, unspecified: Secondary | ICD-10-CM | POA: Diagnosis not present

## 2015-05-04 DIAGNOSIS — F411 Generalized anxiety disorder: Secondary | ICD-10-CM | POA: Diagnosis not present

## 2015-05-04 DIAGNOSIS — F17210 Nicotine dependence, cigarettes, uncomplicated: Secondary | ICD-10-CM | POA: Diagnosis not present

## 2015-05-04 DIAGNOSIS — G43019 Migraine without aura, intractable, without status migrainosus: Secondary | ICD-10-CM | POA: Diagnosis not present

## 2015-05-04 DIAGNOSIS — R319 Hematuria, unspecified: Secondary | ICD-10-CM | POA: Diagnosis not present

## 2015-05-04 DIAGNOSIS — N390 Urinary tract infection, site not specified: Secondary | ICD-10-CM | POA: Diagnosis not present

## 2015-05-15 ENCOUNTER — Ambulatory Visit
Admission: RE | Admit: 2015-05-15 | Discharge: 2015-05-15 | Disposition: A | Discharge status: Home / Self Care | Payer: Medicare Other | Source: Ambulatory Visit | Attending: Physician Assistant | Admitting: Physician Assistant

## 2015-05-15 ENCOUNTER — Other Ambulatory Visit: Payer: Self-pay | Admitting: Physician Assistant

## 2015-05-15 DIAGNOSIS — R109 Unspecified abdominal pain: Secondary | ICD-10-CM

## 2015-05-15 DIAGNOSIS — R319 Hematuria, unspecified: Secondary | ICD-10-CM

## 2015-05-18 DIAGNOSIS — G894 Chronic pain syndrome: Secondary | ICD-10-CM | POA: Diagnosis not present

## 2015-05-18 DIAGNOSIS — Z79891 Long term (current) use of opiate analgesic: Secondary | ICD-10-CM | POA: Diagnosis not present

## 2015-05-18 DIAGNOSIS — G43011 Migraine without aura, intractable, with status migrainosus: Secondary | ICD-10-CM | POA: Diagnosis not present

## 2015-05-18 DIAGNOSIS — G44221 Chronic tension-type headache, intractable: Secondary | ICD-10-CM | POA: Diagnosis not present

## 2015-05-18 DIAGNOSIS — Z5181 Encounter for therapeutic drug level monitoring: Secondary | ICD-10-CM | POA: Diagnosis not present

## 2015-06-02 DIAGNOSIS — R319 Hematuria, unspecified: Secondary | ICD-10-CM | POA: Diagnosis not present

## 2015-06-02 DIAGNOSIS — R300 Dysuria: Secondary | ICD-10-CM | POA: Diagnosis not present

## 2015-06-02 DIAGNOSIS — F331 Major depressive disorder, recurrent, moderate: Secondary | ICD-10-CM | POA: Diagnosis not present

## 2015-06-02 DIAGNOSIS — F411 Generalized anxiety disorder: Secondary | ICD-10-CM | POA: Diagnosis not present

## 2015-06-02 DIAGNOSIS — N390 Urinary tract infection, site not specified: Secondary | ICD-10-CM | POA: Diagnosis not present

## 2015-06-02 DIAGNOSIS — F17210 Nicotine dependence, cigarettes, uncomplicated: Secondary | ICD-10-CM | POA: Diagnosis not present

## 2015-06-04 ENCOUNTER — Other Ambulatory Visit: Payer: Self-pay | Admitting: Nurse Practitioner

## 2015-06-04 DIAGNOSIS — R109 Unspecified abdominal pain: Principal | ICD-10-CM

## 2015-06-04 DIAGNOSIS — R102 Pelvic and perineal pain: Secondary | ICD-10-CM

## 2015-06-11 ENCOUNTER — Ambulatory Visit
Admission: RE | Admit: 2015-06-11 | Discharge: 2015-06-11 | Disposition: A | Discharge status: Home / Self Care | Payer: Medicare Other | Source: Ambulatory Visit | Attending: Nurse Practitioner | Admitting: Nurse Practitioner

## 2015-06-11 DIAGNOSIS — N289 Disorder of kidney and ureter, unspecified: Secondary | ICD-10-CM | POA: Insufficient documentation

## 2015-06-11 DIAGNOSIS — K5641 Fecal impaction: Secondary | ICD-10-CM | POA: Insufficient documentation

## 2015-06-11 DIAGNOSIS — R109 Unspecified abdominal pain: Secondary | ICD-10-CM

## 2015-06-11 DIAGNOSIS — R1030 Lower abdominal pain, unspecified: Secondary | ICD-10-CM | POA: Diagnosis not present

## 2015-06-11 DIAGNOSIS — D179 Benign lipomatous neoplasm, unspecified: Secondary | ICD-10-CM | POA: Diagnosis not present

## 2015-06-11 DIAGNOSIS — N200 Calculus of kidney: Secondary | ICD-10-CM | POA: Diagnosis not present

## 2015-06-11 DIAGNOSIS — R102 Pelvic and perineal pain: Secondary | ICD-10-CM

## 2015-06-11 DIAGNOSIS — R319 Hematuria, unspecified: Secondary | ICD-10-CM | POA: Diagnosis not present

## 2015-06-11 DIAGNOSIS — R918 Other nonspecific abnormal finding of lung field: Secondary | ICD-10-CM | POA: Diagnosis not present

## 2015-06-11 MED ORDER — IOHEXOL 300 MG/ML  SOLN
100.0000 mL | Freq: Once | INTRAMUSCULAR | Status: AC | PRN
  Administered 2015-06-11: 100 mL via INTRAVENOUS

## 2015-06-23 DIAGNOSIS — N200 Calculus of kidney: Secondary | ICD-10-CM | POA: Diagnosis not present

## 2015-06-23 DIAGNOSIS — D4102 Neoplasm of uncertain behavior of left kidney: Secondary | ICD-10-CM | POA: Diagnosis not present

## 2015-06-23 DIAGNOSIS — K5900 Constipation, unspecified: Secondary | ICD-10-CM | POA: Diagnosis not present

## 2015-06-23 DIAGNOSIS — F331 Major depressive disorder, recurrent, moderate: Secondary | ICD-10-CM | POA: Diagnosis not present

## 2015-06-23 DIAGNOSIS — R319 Hematuria, unspecified: Secondary | ICD-10-CM | POA: Diagnosis not present

## 2015-06-24 ENCOUNTER — Other Ambulatory Visit: Payer: Self-pay | Admitting: Physician Assistant

## 2015-06-24 ENCOUNTER — Encounter: Payer: Self-pay | Admitting: Internal Medicine

## 2015-06-24 DIAGNOSIS — N200 Calculus of kidney: Secondary | ICD-10-CM

## 2015-06-24 DIAGNOSIS — R319 Hematuria, unspecified: Secondary | ICD-10-CM

## 2015-06-29 ENCOUNTER — Ambulatory Visit: Payer: Medicare Other | Admitting: Neurology

## 2015-07-03 ENCOUNTER — Ambulatory Visit (INDEPENDENT_AMBULATORY_CARE_PROVIDER_SITE_OTHER): Payer: Medicare Other | Admitting: Urology

## 2015-07-03 ENCOUNTER — Encounter: Payer: Self-pay | Admitting: Urology

## 2015-07-03 VITALS — BP 101/63 | HR 76 | Ht 64.0 in | Wt 137.5 lb

## 2015-07-03 DIAGNOSIS — R102 Pelvic and perineal pain: Secondary | ICD-10-CM | POA: Diagnosis not present

## 2015-07-03 DIAGNOSIS — R3129 Other microscopic hematuria: Secondary | ICD-10-CM | POA: Diagnosis not present

## 2015-07-03 DIAGNOSIS — N200 Calculus of kidney: Secondary | ICD-10-CM

## 2015-07-03 NOTE — Progress Notes (Signed)
07/03/2015 12:55 PM   Glenda [name] 07-12-1955 [id number]  Referring provider: [name], MD [address] [place], [place] [address]  Chief Complaint  Patient presents with  . Hematuria  . Groin Pain    HPI: The patient is a 60 year old female that presents with hematuria. She had a CT scan with and without contrast that showed 3 mm left nonobstructing upper pole calculus as well as a stable 2 cm cyst. The radiologist did recommend a MRI renal protocol which is scheduled for approximately 1 week from now. There were as noted insufficient delayed films as there was contrast in the ureter but the films only went as distal as the proximal ureter. The patient has issues with chronic pain. It sounds like she has had a urethral dilation by Dr. Eliberto Ivory in the past. She complains of pelvic pain and back pain. She sees a neurologist for this at this time. He also complains that she has intense bladder spasms. She was prescribed a medication for this but her insurance does not pay for. She took Azo which did not help. She also feels that she may not empty her bladder. She has an MRI pending for evaluation of her renal cyst.   PMH: Past Medical History  Diagnosis Date  . Chronic headaches     Migraines  . Anxiety state, unspecified   . Depressive disorder, not elsewhere classified   . Esophageal reflux   . Cervicalgia   . Abdominal pain, unspecified site     occasional  . Other and unspecified hyperlipidemia   . Chronic airway obstruction, not elsewhere classified   . Gastroparesis   . Osteoporosis   . PONV (postoperative nausea and vomiting)   . Unspecified essential hypertension     no meds currently for HBP - has been on BP meds in past  . Lung infection Dec 2014  . Difficulty sleeping   . Dizziness   . Chronic periodontitis   . Swelling of ankle     bilateral  . Chiari malformation   . Pseudotumor cerebri   . Chronic headache 02/26/2015    Surgical History: Past Surgical  History  Procedure Laterality Date  . Total abdominal hysterectomy  1995    [facility]  . Laparoscopic cholecystectomy  06/2013    [facility]  . Chiari malformation surgery  2009    [place]  . Multiple extractions with alveoloplasty N/A 11/21/2013    Procedure: Extraction of tooth #'s 2,6,7,8,9,10,11,21,22,23,24,25,26,27,28,29, 32 with alveoloplasty and bilatreal mandibular tori reductions;  Surgeon: Lenn Cal, DDS;  Location: [facility];  Service: Oral Surgery;  Laterality: N/A;  . Breast biopsy Left 05/2014    neg  . Breast biopsy Left 2005    neg  . Cholecystectomy      Home Medications:    Medication List       This list is accurate as of: 07/03/15 12:55 PM.  Always use your most recent med list.               acetaminophen 500 MG tablet  Commonly known as:  TYLENOL  Take 1,000 mg by mouth every 6 (six) hours as needed for mild pain or moderate pain.     BRINTELLIX 20 MG Tabs  Generic drug:  Vortioxetine HBr  Take by mouth.     calcium-vitamin D 250-100 MG-UNIT tablet  Take 1 tablet by mouth 2 (two) times daily.     clonazePAM 1 MG tablet  Commonly known as:  [name]  Take 1 mg by mouth 3 (three) times daily as needed for anxiety.     cyanocobalamin 1000 MCG tablet  Take 100 mcg by mouth daily.     DEXILANT 60 MG capsule  Generic drug:  dexlansoprazole  Take 60 mg by mouth daily.     estrogens (conjugated) 0.3 MG tablet  Commonly known as:  PREMARIN  Take 0.3 mg by mouth daily.     fluticasone 27.5 MCG/SPRAY nasal spray  Commonly known as:  VERAMYST  Place 2 sprays into the nose daily.     levothyroxine 50 MCG tablet  Commonly known as:  SYNTHROID, LEVOTHROID  Take 50 mcg by mouth daily before breakfast.     Magnesium 250 MG Tabs  Take by mouth.     meclizine 25 MG tablet  Commonly known as:  ANTIVERT  Take 25 mg by mouth 3 (three) times daily as needed for dizziness.     oxyCODONE-acetaminophen 10-325 MG tablet  Commonly known as:  PERCOCET  Take 1  tablet by mouth every 6 (six) hours as needed for pain.     OXYCONTIN 20 mg 12 hr tablet  Generic drug:  oxyCODONE  Take 20 mg by mouth every 12 (twelve) hours.     PARoxetine 40 MG tablet  Commonly known as:  PAXIL  Take 40 mg by mouth every morning.     phenazopyridine 200 MG tablet  Commonly known as:  PYRIDIUM  Take 200 mg by mouth 3 (three) times daily as needed for pain.     promethazine 25 MG tablet  Commonly known as:  PHENERGAN  Take 25 mg by mouth every 6 (six) hours as needed for nausea or vomiting.     propranolol 60 MG tablet  Commonly known as:  INDERAL  Take 60 mg by mouth 3 (three) times daily.     tiZANidine 4 MG tablet  Commonly known as:  ZANAFLEX  Take 4 mg by mouth every 8 (eight) hours as needed for muscle spasms.        Allergies:  Allergies  Allergen Reactions  . Doxycycline Itching  . Fentanyl Other (See Comments)    "made me hyper"  . Gabapentin     Patient states it "put her in another world"  . Lyrica [Pregabalin]     Patient states it "put her in another world"  . Morphine And Related Other (See Comments)    "chest pain"    Family History: Family History  Problem Relation Age of Onset  . Diabetes Mother     type II  . Cancer Mother     uterine  . GER disease Mother   . Osteoarthritis Mother   . Breast cancer Mother 57  . GER disease Father   . Stroke Sister   . Cancer Maternal Grandmother   . Cancer Maternal Grandfather   . Heart disease Paternal Grandmother   . Hematuria Paternal Grandmother   . Heart disease Paternal Grandfather   . Hematuria Brother   . Urolithiasis Brother   . Kidney cancer Maternal Uncle     Social History:  reports that she has been smoking Cigarettes.  She has a 40 pack-year smoking history. She has never used smokeless tobacco. She reports that she does not drink alcohol or use illicit drugs.  ROS: UROLOGY Frequent Urination?: No Hard to postpone urination?: No Burning/pain with urination?:  No Get up at night to urinate?: Yes Leakage of urine?: No Urine stream starts and stops?: Yes Trouble starting  stream?: Yes Do you have to strain to urinate?: Yes Blood in urine?: Yes Urinary tract infection?: No Sexually transmitted disease?: No Injury to kidneys or bladder?: No Painful intercourse?: Yes Weak stream?: Yes Currently pregnant?: No Vaginal bleeding?: No Last menstrual period?: n  Gastrointestinal Nausea?: Yes Vomiting?: Yes Indigestion/heartburn?: No Diarrhea?: No Constipation?: Yes  Constitutional Fever: Yes Night sweats?: Yes Weight loss?: Yes Fatigue?: Yes  Skin Skin rash/lesions?: No Itching?: No  Eyes Blurred vision?: Yes Double vision?: No  Ears/Nose/Throat Sore throat?: No Sinus problems?: No  Hematologic/Lymphatic Swollen glands?: No Easy bruising?: No  Cardiovascular Leg swelling?: Yes Chest pain?: No  Respiratory Cough?: No Shortness of breath?: No  Endocrine Excessive thirst?: Yes  Musculoskeletal Back pain?: Yes Joint pain?: Yes  Neurological Headaches?: Yes Dizziness?: Yes  Psychologic Depression?: Yes Anxiety?: Yes  Physical Exam: BP 101/63 mmHg  Pulse 76  Ht 5\' 4"  (1.626 m)  Wt 137 lb 8 oz (62.37 kg)  BMI 23.59 kg/m2  Constitutional:  Alert and oriented, No acute distress. HEENT: [place] AT, moist mucus membranes.  Trachea midline, no masses. Cardiovascular: No clubbing, cyanosis, or edema. Respiratory: Normal respiratory effort, no increased work of breathing. GI: Abdomen is soft, nontender, nondistended, no abdominal masses GU: No CVA tenderness.  Skin: No rashes, bruises or suspicious lesions. Lymph: No cervical or inguinal adenopathy. Neurologic: Grossly intact, no focal deficits, moving all 4 extremities. Psychiatric: Normal mood and affect.  Laboratory Data: Lab Results  Component Value Date   WBC 6.8 11/19/2013   HGB 13.3 11/19/2013   HCT 40.0 11/19/2013   MCV 92.6 11/19/2013   PLT 240  11/19/2013    Lab Results  Component Value Date   CREATININE 0.72 11/19/2013    No results found for: PSA  No results found for: TESTOSTERONE  No results found for: HGBA1C  Urinalysis    Component Value Date/Time   COLORURINE Colorless 10/04/2012 0223   APPEARANCEUR Clear 10/04/2012 0223   LABSPEC 1.002 10/04/2012 0223   PHURINE 6.0 10/04/2012 0223   GLUCOSEU Negative 10/04/2012 0223   HGBUR 1+ 10/04/2012 0223   BILIRUBINUR Negative 10/04/2012 0223   KETONESUR Negative 10/04/2012 0223   PROTEINUR Negative 10/04/2012 0223   NITRITE Negative 10/04/2012 0223   LEUKOCYTESUR Negative 10/04/2012 0223    Pertinent Imaging: CLINICAL DATA: Pelvic pain and right flank pain worsening over 3-4 months. Intermittent gross hematuria. Constipation.  EXAM: CT ABDOMEN AND PELVIS WITHOUT AND WITH CONTRAST  TECHNIQUE: Multidetector CT imaging of the abdomen and pelvis was performed following the standard protocol before and following the bolus administration of intravenous contrast.  CONTRAST: 131mL OMNIPAQUE IOHEXOL 300 MG/ML SOLN  COMPARISON: Multiple exams, including 10/04/2012  FINDINGS: Lower chest: Stable clustered nodularity at the right lung base, no change from 10/04/2012.  Hepatobiliary: Cholecystectomy.  Pancreas: Unremarkable  Spleen: Unremarkable  Adrenals/Urinary Tract: 2.1 by 1.1 by 0.8 cm hypodense lesion of the left mid kidney, previously 1.9 by 1.1 cm on 10/04/2012, increases 12-13 Hounsfield units on postcontrast images compared to precontrast although much of this may be from volume averaging and pseudo enhancement. However, this degree of increase is considered in the indeterminate range for enhancement.  3 mm nonobstructive left kidney upper pole calculus.  Stomach/Bowel: Prominent stool throughout the colon favors constipation. The appendix is not well seen.  Vascular/Lymphatic: Mild atherosclerotic calcification of the  right common iliac artery. No adenopathy identified.  Reproductive: Uterus absent. Ovaries not well seen.  Other: No supplemental non-categorized findings.  Musculoskeletal: 5.9 by 2.3 by 6.4  cm lipoma deep to the right upper external oblique muscle. Mild degenerative spurring of the right femoral head.  IMPRESSION: 1. 3 mm nonobstructive left kidney upper pole calculus. 2. A fairly stable 2.1 by 1.1 by 0.8 cm hypodense lesion of the left mid kidney is indeterminate for enhancement. This is likely to represent a benign cyst with equivocal enhancement due to volume averaging and/or pseudo enhancement. Consider renal protocol MRI with and without contrast for definitive characterization, to exclude a small renal cell carcinoma. 3. Prominent stool throughout the colon favors constipation. 4. 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 5. Stable clustered nodularity at the right lung base, considered benign as it is not changed since 10/04/2012.  Assessment & Plan:   I discussed in great detail with the patient the workup for microscopic hematuria. She understands her CT scan is not fully delineate her mid and distal ureter. She understands the workup for this involves a complete CT urogram to evaluate her ureters as well as cystoscopy. She also has MRI pending for her renal cysts. She'll follow up for cystoscopy after her renal cyst is better visualize her MRI. She may need a repeat CT urogram for full visualization of years in 3-6 months. I will also perform a full pelvic exam at the time of her cystoscopy due to her overactive bladder symptoms with feeling of incomplete emptying.  1. Microscopic Hematuria -return for cystoscopy in 2 weeks -May need repeat CT urogram in 3-6 months for full evaluation of her distal ureters.  2. Left Renal cyst -follow up to discuss pending MRI renal protocol results  3.  Overactive  bladder -will plan for pelvic exam at time of cystoscopy  Return in about 2 weeks (around 07/17/2015) for cystoscopy.  Nickie Retort, MD  [facility] [address] [place], [place] [address] [phone]

## 2015-07-10 ENCOUNTER — Ambulatory Visit
Admission: RE | Admit: 2015-07-10 | Discharge: 2015-07-10 | Disposition: A | Discharge status: Home / Self Care | Payer: Medicare Other | Source: Ambulatory Visit | Attending: Physician Assistant | Admitting: Physician Assistant

## 2015-07-10 DIAGNOSIS — N2889 Other specified disorders of kidney and ureter: Secondary | ICD-10-CM | POA: Insufficient documentation

## 2015-07-10 DIAGNOSIS — R319 Hematuria, unspecified: Secondary | ICD-10-CM | POA: Diagnosis not present

## 2015-07-10 DIAGNOSIS — N200 Calculus of kidney: Secondary | ICD-10-CM

## 2015-07-10 LAB — POCT I-STAT CREATININE: Creatinine, Ser: 0.8 mg/dL (ref 0.44–1.00)

## 2015-07-10 MED ORDER — GADOBENATE DIMEGLUMINE 529 MG/ML IV SOLN
15.0000 mL | Freq: Once | INTRAVENOUS | Status: AC | PRN
  Administered 2015-07-10: 12 mL via INTRAVENOUS

## 2015-07-14 DIAGNOSIS — N200 Calculus of kidney: Secondary | ICD-10-CM | POA: Diagnosis not present

## 2015-07-14 DIAGNOSIS — K5900 Constipation, unspecified: Secondary | ICD-10-CM | POA: Diagnosis not present

## 2015-07-14 DIAGNOSIS — N281 Cyst of kidney, acquired: Secondary | ICD-10-CM | POA: Diagnosis not present

## 2015-07-16 ENCOUNTER — Other Ambulatory Visit: Payer: Self-pay | Admitting: Physician Assistant

## 2015-07-17 ENCOUNTER — Ambulatory Visit (INDEPENDENT_AMBULATORY_CARE_PROVIDER_SITE_OTHER): Payer: Medicare Other | Admitting: Urology

## 2015-07-17 VITALS — BP 119/76 | HR 81 | Ht 64.0 in | Wt 138.0 lb

## 2015-07-17 DIAGNOSIS — N200 Calculus of kidney: Secondary | ICD-10-CM

## 2015-07-17 DIAGNOSIS — N3281 Overactive bladder: Secondary | ICD-10-CM | POA: Diagnosis not present

## 2015-07-17 DIAGNOSIS — R3129 Other microscopic hematuria: Secondary | ICD-10-CM

## 2015-07-17 DIAGNOSIS — Q6100 Congenital renal cyst, unspecified: Secondary | ICD-10-CM | POA: Diagnosis not present

## 2015-07-17 DIAGNOSIS — N281 Cyst of kidney, acquired: Secondary | ICD-10-CM

## 2015-07-17 LAB — URINALYSIS, COMPLETE
BILIRUBIN UA: NEGATIVE
GLUCOSE, UA: NEGATIVE
KETONES UA: NEGATIVE
Leukocytes, UA: NEGATIVE
Nitrite, UA: NEGATIVE
PROTEIN UA: NEGATIVE
UUROB: 0.2 mg/dL (ref 0.2–1.0)
pH, UA: 6 (ref 5.0–7.5)

## 2015-07-17 LAB — MICROSCOPIC EXAMINATION
Bacteria, UA: NONE SEEN
WBC UA: NONE SEEN /HPF (ref 0–?)

## 2015-07-17 MED ORDER — LIDOCAINE HCL 2 % EX GEL
1.0000 "application " | Freq: Once | CUTANEOUS | Status: AC
  Administered 2015-07-17: 1 via URETHRAL

## 2015-07-17 MED ORDER — CIPROFLOXACIN HCL 500 MG PO TABS
500.0000 mg | ORAL_TABLET | Freq: Once | ORAL | Status: AC
  Administered 2015-07-17: 500 mg via ORAL

## 2015-07-17 NOTE — Progress Notes (Signed)
07/17/2015 3:14 PM   [name] 05-14-1956 [id number]  Referring provider: [name], MD [address] [place], [place] [address]  Chief Complaint  Patient presents with  . Cysto    HPI: The patient is a 60 year old female that presents with hematuria. She had a CT scan with and without contrast that showed 3 mm left nonobstructing upper pole calculus as well as a stable 2 cm cyst. The radiologist did recommend a MRI renal protocol which is scheduled for approximately 1 week from now. The patient has issues with chronic pain. It sounds like she has had a urethral dilation by Dr. Eliberto Ivory in the past. She complains of pelvic pain and back pain. She sees a neurologist for this at this time. He also complains that she has intense bladder spasms. She was prescribed a medication for this but her insurance does not pay for. She took Azo which did not help. She also feels that she may not empty her bladder. She has an MRI pending for evaluation of her renal cyst.   March 2017 interval history: The patient underwent an MRI which showed a left stable cysts Bosniak score to left. The radiologist recommended that she needs only one more final MRI in 1 year to ensure the cyst stays stable in size since it has been relatively stable since at least 2003. She denies any other complaints at this time. Her cystoscopy was negative.  She again discusses her bladder spasms with urinary frequency and urgency. She'll be given a medication before for this however she was unable to fill it because it was too expensive. Herpes complaint of the spasms as well as her urgency. She denies incontinence at any time. She feels that she empties her bladder. She does sometimes strain to start urination but otherwise has no issues with her stream.  PMH: Past Medical History  Diagnosis Date  . Chronic headaches     Migraines  . Anxiety state, unspecified   . Depressive disorder, not elsewhere classified   . Esophageal  reflux   . Cervicalgia   . Abdominal pain, unspecified site     occasional  . Other and unspecified hyperlipidemia   . Chronic airway obstruction, not elsewhere classified   . Gastroparesis   . Osteoporosis   . PONV (postoperative nausea and vomiting)   . Unspecified essential hypertension     no meds currently for HBP - has been on BP meds in past  . Lung infection Dec 2014  . Difficulty sleeping   . Dizziness   . Chronic periodontitis   . Swelling of ankle     bilateral  . Chiari malformation   . Pseudotumor cerebri   . Chronic headache 02/26/2015    Surgical History: Past Surgical History  Procedure Laterality Date  . Total abdominal hysterectomy  1995    [facility]  . Laparoscopic cholecystectomy  06/2013    [facility]  . Chiari malformation surgery  2009    [place]  . Multiple extractions with alveoloplasty N/A 11/21/2013    Procedure: Extraction of tooth #'s 2,6,7,8,9,10,11,21,22,23,24,25,26,27,28,29, 32 with alveoloplasty and bilatreal mandibular tori reductions;  Surgeon: Lenn Cal, DDS;  Location: [facility];  Service: Oral Surgery;  Laterality: N/A;  . Breast biopsy Left 05/2014    neg  . Breast biopsy Left 2005    neg  . Cholecystectomy      Home Medications:    Medication List       This list is accurate as of:  07/17/15  3:14 PM.  Always use your most recent med list.               acetaminophen 500 MG tablet  Commonly known as:  TYLENOL  Take 1,000 mg by mouth every 6 (six) hours as needed for mild pain or moderate pain.     BRINTELLIX 20 MG Tabs  Generic drug:  Vortioxetine HBr  Take by mouth.     calcium-vitamin D 250-100 MG-UNIT tablet  Take 1 tablet by mouth 2 (two) times daily.     clonazePAM 1 MG tablet  Commonly known as:  KLONOPIN  Take 1 mg by mouth 3 (three) times daily as needed for anxiety.     cyanocobalamin 1000 MCG tablet  Take 100 mcg by mouth daily.     DEXILANT 60 MG capsule  Generic drug:  dexlansoprazole  Take 60 mg by mouth  daily.     estrogens (conjugated) 0.3 MG tablet  Commonly known as:  PREMARIN  Take 0.3 mg by mouth daily.     fluticasone 27.5 MCG/SPRAY nasal spray  Commonly known as:  VERAMYST  Place 2 sprays into the nose daily.     levothyroxine 50 MCG tablet  Commonly known as:  SYNTHROID, LEVOTHROID  Take 50 mcg by mouth daily before breakfast.     Magnesium 250 MG Tabs  Take by mouth.     meclizine 25 MG tablet  Commonly known as:  ANTIVERT  Take 25 mg by mouth 3 (three) times daily as needed for dizziness.     oxyCODONE-acetaminophen 10-325 MG tablet  Commonly known as:  PERCOCET  Take 1 tablet by mouth every 6 (six) hours as needed for pain.     OXYCONTIN 20 mg 12 hr tablet  Generic drug:  oxyCODONE  Take 20 mg by mouth every 12 (twelve) hours.     PARoxetine 40 MG tablet  Commonly known as:  PAXIL  Take 40 mg by mouth every morning.     phenazopyridine 200 MG tablet  Commonly known as:  PYRIDIUM  Take 200 mg by mouth 3 (three) times daily as needed for pain.     promethazine 25 MG tablet  Commonly known as:  PHENERGAN  Take 25 mg by mouth every 6 (six) hours as needed for nausea or vomiting.     propranolol 60 MG tablet  Commonly known as:  INDERAL  Take 60 mg by mouth 3 (three) times daily.     tiZANidine 4 MG tablet  Commonly known as:  ZANAFLEX  Take 4 mg by mouth every 8 (eight) hours as needed for muscle spasms.        Allergies:  Allergies  Allergen Reactions  . Doxycycline Itching  . Fentanyl Other (See Comments)    "made me hyper"  . Gabapentin     Patient states it "put her in another world"  . Lyrica [Pregabalin]     Patient states it "put her in another world"  . Morphine And Related Other (See Comments)    "chest pain"    Family History: Family History  Problem Relation Age of Onset  . Diabetes Mother     type II  . Cancer Mother     uterine  . GER disease Mother   . Osteoarthritis Mother   . Breast cancer Mother 75  . GER disease  Father   . Stroke Sister   . Cancer Maternal Grandmother   . Cancer Maternal Grandfather   . Heart disease  Paternal Grandmother   . Hematuria Paternal Grandmother   . Heart disease Paternal Grandfather   . Hematuria Brother   . Urolithiasis Brother   . Kidney cancer Maternal Uncle     Social History:  reports that she has been smoking Cigarettes.  She has a 40 pack-year smoking history. She has never used smokeless tobacco. She reports that she does not drink alcohol or use illicit drugs.  ROS:                                        Physical Exam: BP 119/76 mmHg  Pulse 81  Ht 5\' 4"  (1.626 m)  Wt 138 lb (62.596 kg)  BMI 23.68 kg/m2  Constitutional:  Alert and oriented, No acute distress. HEENT: [place] AT, moist mucus membranes.  Trachea midline, no masses. Cardiovascular: No clubbing, cyanosis, or edema. Respiratory: Normal respiratory effort, no increased work of breathing. GI: Abdomen is soft, nontender, nondistended, no abdominal masses GU: No CVA tenderness. Pelvic exam: Normal vaginal vault. Normal urethral meatus. No incontinence on coughing or Valsalva. No sign of cystocele. Skin: No rashes, bruises or suspicious lesions. Lymph: No cervical or inguinal adenopathy. Neurologic: Grossly intact, no focal deficits, moving all 4 extremities. Psychiatric: Normal mood and affect.  Laboratory Data: Lab Results  Component Value Date   WBC 6.8 11/19/2013   HGB 13.3 11/19/2013   HCT 40.0 11/19/2013   MCV 92.6 11/19/2013   PLT 240 11/19/2013    Lab Results  Component Value Date   CREATININE 0.80 07/10/2015    No results found for: PSA  No results found for: TESTOSTERONE  No results found for: HGBA1C  Urinalysis    Component Value Date/Time   COLORURINE Colorless 10/04/2012 0223   APPEARANCEUR Clear 10/04/2012 0223   LABSPEC 1.002 10/04/2012 0223   PHURINE 6.0 10/04/2012 0223   GLUCOSEU Negative 10/04/2012 0223   HGBUR 1+ 10/04/2012 0223    BILIRUBINUR Negative 10/04/2012 0223   KETONESUR Negative 10/04/2012 0223   PROTEINUR Negative 10/04/2012 0223   NITRITE Negative 10/04/2012 0223   LEUKOCYTESUR Negative 10/04/2012 0223    Pertinent Imaging: CLINICAL DATA: Hematuria. Left renal mass. Left-sided renal calculi.  EXAM: MRI ABDOMEN WITHOUT AND WITH CONTRAST  TECHNIQUE: Multiplanar multisequence MR imaging of the abdomen was performed both before and after the administration of intravenous contrast.  CONTRAST: 59mL MULTIHANCE GADOBENATE DIMEGLUMINE 529 MG/ML IV SOLN  COMPARISON: 06/11/2015  FINDINGS: Lower chest: Unremarkable  Hepatobiliary: Cholecystectomy.  Pancreas: Unremarkable  Spleen: Unremarkable  Adrenals/Urinary Tract: Adrenal glands normal. Right kidney normal. Left mid kidney 2.0 by 1.2 cm lesion has generally high T2 signal with some posterior high T1 and slightly lower T2 signal, and several internal septations. On subtraction images I do not see definite enhancement within this lesion.  Stomach/Bowel: Gastric diverticulum noted.  Vascular/Lymphatic: Unremarkable  Other: No supplemental non-categorized findings.  Musculoskeletal: Lipoma between the right internal and external oblique muscles along the lower margin of the rib cage.  IMPRESSION: 1. Complex left mid kidney lesion with internal complexity and septation, but no definite enhancement on subtraction images. The lesion measures similar in size 10/04/2012. This lesion was recorded is measuring 9 mm on a prior CT from 10/12/2001. On a CT chest from 06/17/2008 this measured approximately 1.4 by 0.9 cm. Because of the possible very slow growth and the patient's chronic hematuria (which of course may be alternatively due to the known  small left-sided nonobstructive renal calculus), it is likely most reasonable to treat this as a Bosniak category 23F cyst (likely benign, but warranting follow up). Given the reasonable  lack of growth over the last 2 years, and in the context of the patient's nephrolithiasis and hematuria I would suggest 1 more year of follow up imaging to confirm the benign nature of this lesion. Accordingly, I recommend a final renal protocol MRI with and without contrast in 1 years time.    Cystoscopy Procedure Note  Patient identification was confirmed, informed consent was obtained, and patient was prepped using Betadine solution.  Lidocaine jelly was administered per urethral meatus.    Preoperative abx where received prior to procedure.    Procedure: - Flexible cystoscope introduced, without any difficulty.   - Thorough search of the bladder revealed:    normal urethral meatus    normal urothelium    no stones    no ulcers     no tumors    no urethral polyps    no trabeculation  - Ureteral orifices were normal in position and appearance.  Post-Procedure: - Patient tolerated the procedure well   Assessment & Plan:    1. Microscopic Hematuria -negative work up. Repeat urinalysis in one year.  2. Left Renal cyst 23F -This has been stable for a number of years now. The radiologist recommends one final repeat MRI with and without contrast in 1 year to ensure the mass is benign.  3. Overactive bladder -Year the patient a months worth of Myrbetriq 25 mg sample today. She was given a medication similar in the past but was unable afford it due to her insurance. She'll see if this medication works for her and then contact the office. We'll try to find a medication at that time that is suitable to her insurance company. She'll follow-up in 3 months or sooner if this medication does not work.  4. 3 mm left non obstructing stone No intervention necessary at this time.  Return in about 3 months (around 10/17/2015).  Nickie Retort, MD  [facility] [address], [place], [place] [address] [phone]

## 2015-08-07 ENCOUNTER — Inpatient Hospital Stay
Admission: RE | Admit: 2015-08-07 | Discharge: 2015-08-07 | Disposition: A | Discharge status: Home / Self Care | Payer: Self-pay | Source: Ambulatory Visit | Attending: *Deleted | Admitting: *Deleted

## 2015-08-07 ENCOUNTER — Other Ambulatory Visit: Payer: Self-pay | Admitting: Internal Medicine

## 2015-08-07 ENCOUNTER — Other Ambulatory Visit: Payer: Self-pay | Admitting: *Deleted

## 2015-08-07 DIAGNOSIS — Z9289 Personal history of other medical treatment: Secondary | ICD-10-CM

## 2015-08-07 DIAGNOSIS — N63 Unspecified lump in unspecified breast: Secondary | ICD-10-CM

## 2015-08-07 DIAGNOSIS — Z1231 Encounter for screening mammogram for malignant neoplasm of breast: Secondary | ICD-10-CM

## 2015-09-08 DIAGNOSIS — G43011 Migraine without aura, intractable, with status migrainosus: Secondary | ICD-10-CM | POA: Diagnosis not present

## 2015-09-08 DIAGNOSIS — G44221 Chronic tension-type headache, intractable: Secondary | ICD-10-CM | POA: Diagnosis not present

## 2015-09-08 DIAGNOSIS — G894 Chronic pain syndrome: Secondary | ICD-10-CM | POA: Diagnosis not present

## 2015-09-08 DIAGNOSIS — Z5181 Encounter for therapeutic drug level monitoring: Secondary | ICD-10-CM | POA: Diagnosis not present

## 2015-09-08 DIAGNOSIS — Z79891 Long term (current) use of opiate analgesic: Secondary | ICD-10-CM | POA: Diagnosis not present

## 2015-10-22 ENCOUNTER — Ambulatory Visit: Payer: Medicare Other

## 2015-10-27 ENCOUNTER — Other Ambulatory Visit: Payer: Self-pay | Admitting: Physician Assistant

## 2015-10-27 ENCOUNTER — Other Ambulatory Visit: Payer: Self-pay | Admitting: Internal Medicine

## 2015-10-27 DIAGNOSIS — Z1231 Encounter for screening mammogram for malignant neoplasm of breast: Secondary | ICD-10-CM

## 2015-10-27 DIAGNOSIS — D242 Benign neoplasm of left breast: Secondary | ICD-10-CM

## 2015-11-05 DIAGNOSIS — Z79891 Long term (current) use of opiate analgesic: Secondary | ICD-10-CM | POA: Diagnosis not present

## 2015-11-05 DIAGNOSIS — G894 Chronic pain syndrome: Secondary | ICD-10-CM | POA: Diagnosis not present

## 2015-11-05 DIAGNOSIS — G43011 Migraine without aura, intractable, with status migrainosus: Secondary | ICD-10-CM | POA: Diagnosis not present

## 2015-11-05 DIAGNOSIS — G44221 Chronic tension-type headache, intractable: Secondary | ICD-10-CM | POA: Diagnosis not present

## 2015-11-19 ENCOUNTER — Ambulatory Visit: Payer: Medicare Other

## 2015-11-19 ENCOUNTER — Other Ambulatory Visit: Payer: Self-pay

## 2015-12-03 ENCOUNTER — Ambulatory Visit: Admission: RE | Admit: 2015-12-03 | Payer: Medicare Other | Source: Ambulatory Visit

## 2015-12-03 ENCOUNTER — Other Ambulatory Visit: Payer: Self-pay

## 2015-12-16 ENCOUNTER — Encounter: Payer: Self-pay | Admitting: Emergency Medicine

## 2015-12-16 ENCOUNTER — Emergency Department
Admission: EM | Admit: 2015-12-16 | Discharge: 2015-12-17 | Disposition: A | Discharge status: Home / Self Care | Payer: Medicare Other | Attending: Emergency Medicine | Admitting: Emergency Medicine

## 2015-12-16 ENCOUNTER — Emergency Department: Payer: Medicare Other

## 2015-12-16 DIAGNOSIS — Z7951 Long term (current) use of inhaled steroids: Secondary | ICD-10-CM | POA: Diagnosis not present

## 2015-12-16 DIAGNOSIS — R079 Chest pain, unspecified: Secondary | ICD-10-CM | POA: Diagnosis not present

## 2015-12-16 DIAGNOSIS — I10 Essential (primary) hypertension: Secondary | ICD-10-CM | POA: Diagnosis not present

## 2015-12-16 DIAGNOSIS — R0789 Other chest pain: Secondary | ICD-10-CM | POA: Diagnosis not present

## 2015-12-16 DIAGNOSIS — F17210 Nicotine dependence, cigarettes, uncomplicated: Secondary | ICD-10-CM | POA: Insufficient documentation

## 2015-12-16 DIAGNOSIS — Z791 Long term (current) use of non-steroidal anti-inflammatories (NSAID): Secondary | ICD-10-CM | POA: Insufficient documentation

## 2015-12-16 DIAGNOSIS — Z79899 Other long term (current) drug therapy: Secondary | ICD-10-CM | POA: Insufficient documentation

## 2015-12-16 LAB — BASIC METABOLIC PANEL
ANION GAP: 4 — AB (ref 5–15)
BUN: 7 mg/dL (ref 6–20)
CALCIUM: 9 mg/dL (ref 8.9–10.3)
CO2: 30 mmol/L (ref 22–32)
CREATININE: 0.88 mg/dL (ref 0.44–1.00)
Chloride: 103 mmol/L (ref 101–111)
GFR calc Af Amer: >60 mL/min (ref >60)
GLUCOSE: 86 mg/dL (ref 65–99)
Potassium: 4.2 mmol/L (ref 3.5–5.1)
Sodium: 137 mmol/L (ref 135–145)

## 2015-12-16 LAB — CBC
HCT: 39.8 % (ref 35.0–47.0)
HEMOGLOBIN: 13.8 g/dL (ref 12.0–16.0)
MCH: 31.9 pg (ref 26.0–34.0)
MCHC: 34.8 g/dL (ref 32.0–36.0)
MCV: 91.8 fL (ref 80.0–100.0)
Platelets: 237 10*3/uL (ref 150–440)
RBC: 4.34 MIL/uL (ref 3.80–5.20)
RDW: 13.1 % (ref 11.5–14.5)
WBC: 10.7 10*3/uL (ref 3.6–11.0)

## 2015-12-16 LAB — TROPONIN I

## 2015-12-16 MED ORDER — ONDANSETRON 4 MG PO TBDP
4.0000 mg | ORAL_TABLET | Freq: Once | ORAL | Status: AC
Start: 2015-12-16 — End: 2015-12-16
  Administered 2015-12-16: 4 mg via ORAL
  Filled 2015-12-16: qty 1

## 2015-12-16 MED ORDER — ACETAMINOPHEN 325 MG PO TABS
650.0000 mg | ORAL_TABLET | Freq: Once | ORAL | Status: AC
  Administered 2015-12-16: 650 mg via ORAL
  Filled 2015-12-16: qty 2

## 2015-12-16 NOTE — ED Notes (Signed)
Pt given tylenol and zofran. Pt also given cola to drink per her request. Will continue to monitor. Pt awaiting for do 2nd troponin at 1am. No distress noted.

## 2015-12-16 NOTE — ED Triage Notes (Signed)
Pt arrived from home by EMS with c/o chest pain and pressure. EMS reports pt has had pain and pressure in chest x2 weeks and today the symptoms increased. Pt received 2 Nitro tablets and 324mg  Aspirin in route. Pts pain improved post meds from a 9/10 to 2/10.

## 2015-12-16 NOTE — ED Notes (Signed)
Report from Alicia RN

## 2015-12-17 DIAGNOSIS — R0789 Other chest pain: Secondary | ICD-10-CM | POA: Diagnosis not present

## 2015-12-17 LAB — TROPONIN I

## 2015-12-17 NOTE — ED Notes (Signed)
Discharge instructions reviewed with patient. Questions fielded by this RN. Patient verbalizes understanding of instructions. Patient discharged home in stable condition per Brown MD . No acute distress noted at time of discharge.   

## 2015-12-17 NOTE — ED Provider Notes (Signed)
Time Seen: Approximately 10 PM  I have reviewed the triage notes  Chief Complaint: Chest Pain   History of Present Illness: Glenda [name] is a 60 y.o. female who presents with intermittent chest discomfort now for the past 2 weeks. Patient was transported by EMS tonight after pain seemed to get more intense at 7 PM. She states she's had it intermittently throughout the day. Patient describes some nausea and no vomiting. Some mild shortness of breath. She has history of gastroesophageal reflux disease and states that she had a heart catheterization several years ago which did not show any significant abnormalities. The patient received aspirin and nitroglycerin with symptomatic improvement. Patient denies any fever or chills or productive cough. She denies any pleuritic or positional component   Past Medical History:  Diagnosis Date  . Abdominal pain, unspecified site    occasional  . Anxiety state, unspecified   . Cervicalgia   . Chiari malformation   . Chronic airway obstruction, not elsewhere classified   . Chronic headache 02/26/2015  . Chronic headaches    Migraines  . Chronic periodontitis   . Depressive disorder, not elsewhere classified   . Difficulty sleeping   . Dizziness   . Esophageal reflux   . Gastroparesis   . Lung infection Dec 2014  . Osteoporosis   . Other and unspecified hyperlipidemia   . PONV (postoperative nausea and vomiting)   . Pseudotumor cerebri   . Swelling of ankle    bilateral  . Unspecified essential hypertension    no meds currently for HBP - has been on BP meds in past    Patient Active Problem List   Diagnosis Date Noted  . Chronic headache 02/26/2015  . Cervical spondylosis without myelopathy 02/26/2015  . G E R D 06/21/2008  . DEPRESSION 06/06/2008  . LUNG NODULE 06/06/2008    Past Surgical History:  Procedure Laterality Date  . BREAST BIOPSY Left 05/2014   neg  . BREAST BIOPSY Left 2005   neg  . Chiari Malformation Surgery   2009   [place]  . CHOLECYSTECTOMY    . LAPAROSCOPIC CHOLECYSTECTOMY  06/2013   [facility]  . MULTIPLE EXTRACTIONS WITH ALVEOLOPLASTY N/A 11/21/2013   Procedure: Extraction of tooth #'s 2,6,7,8,9,10,11,21,22,23,24,25,26,27,28,29, 32 with alveoloplasty and bilatreal mandibular tori reductions;  Surgeon: Lenn Cal, DDS;  Location: [facility];  Service: Oral Surgery;  Laterality: N/A;  . TOTAL ABDOMINAL HYSTERECTOMY  1995   [place]    Past Surgical History:  Procedure Laterality Date  . BREAST BIOPSY Left 05/2014   neg  . BREAST BIOPSY Left 2005   neg  . Chiari Malformation Surgery  2009   [place]  . CHOLECYSTECTOMY    . LAPAROSCOPIC CHOLECYSTECTOMY  06/2013   [facility]  . MULTIPLE EXTRACTIONS WITH ALVEOLOPLASTY N/A 11/21/2013   Procedure: Extraction of tooth #'s 2,6,7,8,9,10,11,21,22,23,24,25,26,27,28,29, 32 with alveoloplasty and bilatreal mandibular tori reductions;  Surgeon: Lenn Cal, DDS;  Location: [facility];  Service: Oral Surgery;  Laterality: N/A;  . TOTAL ABDOMINAL HYSTERECTOMY  1995   [facility]    Current Outpatient Rx  . Order #: [id number] Class: Historical Med  . Order #: [id number] Class: Historical Med  . Order #: [id number] Class: Historical Med  . Order #: [id number] Class: Historical Med  . Order #: [id number] Class: Historical Med  . Order #: [id number] Class: Historical Med  . Order #: [id number] Class: Historical Med  . Order #: [id number] Class: Historical Med  . Order #: [id number] Class: Historical Med  . Order #: [id number] Class: Historical Med  .  Order #: [id number] Class: Historical Med  . Order #: [id number] Class: Historical Med  . Order #: [id number] Class: Historical Med  . Order #: [id number] Class: Historical Med  . Order #: [id number] Class: Historical Med    Allergies:  Doxycycline; Fentanyl; Gabapentin; Lyrica [pregabalin]; and Morphine and related  Family History: Family History  Problem Relation Age of Onset  . Diabetes Mother     type II  . Cancer Mother     uterine  . GER disease  Mother   . Osteoarthritis Mother   . Breast cancer Mother 64  . GER disease Father   . Stroke Sister   . Cancer Maternal Grandmother   . Cancer Maternal Grandfather   . Heart disease Paternal Grandmother   . Hematuria Paternal Grandmother   . Heart disease Paternal Grandfather   . Hematuria Brother   . Urolithiasis Brother   . Kidney cancer Maternal Uncle     Social History: Social History  Substance Use Topics  . Smoking status: Current Every Day Smoker    Packs/day: 1.00    Years: 40.00    Types: Cigarettes  . Smokeless tobacco: Never Used  . Alcohol use No     Comment: Denies     Review of Systems:   10 point review of systems was performed and was otherwise negative:  Constitutional: No fever Eyes: No visual disturbances ENT: No sore throat, ear pain Cardiac: Chest pain substernal with radiation toward the middle of her back and occasionally up toward the neck region Respiratory: No shortness of breath, wheezing, or stridor Abdomen: No abdominal pain, no vomiting, No diarrhea Endocrine: No weight loss, No night sweats Extremities: No peripheral edema, cyanosis Skin: No rashes, easy bruising Neurologic: No focal weakness, trouble with speech or swollowing Urologic: No dysuria, Hematuria, or urinary frequency   Physical Exam:  ED Triage Vitals  Enc Vitals Group     BP 12/16/15 2152 125/79     Pulse Rate 12/16/15 2150 62     Resp 12/16/15 2150 16     Temp 12/16/15 2150 98 F (36.7 C)     Temp Source 12/16/15 2150 Oral     SpO2 12/16/15 2147 99 %     Weight 12/16/15 2150 132 lb (59.9 kg)     Height 12/16/15 2150 5\' 4"  (1.626 m)     Head Circumference --      Peak Flow --      Pain Score 12/16/15 2151 2     Pain Loc --      Pain Edu? --      Excl. in [place]? --     General: Awake , Alert , and Oriented times 3; GCS 15 Anxious Head: Normal cephalic , atraumatic Eyes: Pupils equal , round, reactive to light Nose/Throat: No nasal drainage, patent upper  airway without erythema or exudate.  Neck: Supple, Full range of motion, No anterior adenopathy or palpable thyroid masses Lungs: Clear to ascultation without wheezes , rhonchi, or rales Heart: Regular rate, regular rhythm without murmurs , gallops , or rubs Abdomen: Soft, non tender without rebound, guarding , or rigidity; bowel sounds positive and symmetric in all 4 quadrants. No organomegaly .        Extremities: 2 plus symmetric pulses. No edema, clubbing or cyanosis Neurologic: normal ambulation, Motor symmetric without deficits, sensory intact Skin: warm, dry, no rashes   Labs:   All laboratory work was reviewed including any pertinent negatives or positives listed below:  [place] -  Abnormal; Notable for the following:       Result Value   Anion gap 4 (*)    All other components within normal limits  CBC  TROPONIN I  TROPONIN I  Laboratory work was reviewed and showed no clinically significant abnormalities.   EKG:  ED ECG REPORT I, Daymon Larsen, the attending physician, personally viewed and interpreted this ECG.  Date: 12/17/2015 EKG Time: 2156 Rate: *Is 58 Rhythm: normal sinus rhythm QRS Axis: normal Intervals: normal ST/T Wave abnormalities: Nonspecific T wave abnormalities Conduction Disturbances: none Narrative Interpretation: unremarkable No acute ischemic change   Radiology:  CLINICAL DATA:  60 year old female with chest pain  EXAM: CHEST  2 VIEW  COMPARISON:  Chest radiograph dated 11/19/2013  FINDINGS: Subcentimeter faint right lower lobe nodularity or scarring appears similar to prior study. The lungs are otherwise clear. There is no pleural effusion or pneumothorax. The cardiac silhouette is within normal limits. No acute osseous pathology.  IMPRESSION: No active cardiopulmonary disease.   Electronically Signed   By: Anner Crete M.D.   On: 12/16/2015 22:42   I personally reviewed the  radiologic studies     ED Course: *  Patient's stay with [place] far as uneventful Differential includes all life-threatening causes for chest pain. This includes but is not exclusive to acute coronary syndrome, aortic dissection, pulmonary embolism, cardiac tamponade, community-acquired pneumonia, pericarditis, musculoskeletal chest wall pain, etc.  Given that she's had this pain now for approximately 2 weeks and also including earlier today without an abnormal EKG and first troponin negative. I felt we could repeat the troponin for the patient if it still continues to be negative she could follow up with her cardiologist on an outpatient basis. Advised take an aspirin a day. I felt was unlikely to be a life-threatening cause of patient's resting fairly comfortably in a stretcher at this time.  Patient will be checked out to my colleague Dr. Owens Shark to follow second troponin with a tentative plan for discharge Clinical Course     Assessment: * Acute unspecified chest pain     Plan: * Plan at this time is outpatient management            Daymon Larsen, MD 12/17/15 [phone]

## 2015-12-17 NOTE — Discharge Instructions (Signed)
Please return immediately if condition worsens. Please contact her primary physician or the physician you were given for referral. If you have any specialist physicians involved in her treatment and plan please also contact them. Thank you for using [facility].  Please take an aspirin a day and follow up with her cardiologist as soon as possible. Please continue all your other current medications.

## 2015-12-24 ENCOUNTER — Ambulatory Visit
Admission: RE | Admit: 2015-12-24 | Discharge: 2015-12-24 | Disposition: A | Discharge status: Home / Self Care | Payer: Medicare Other | Source: Ambulatory Visit | Attending: Physician Assistant | Admitting: Physician Assistant

## 2015-12-24 ENCOUNTER — Ambulatory Visit
Admission: RE | Admit: 2015-12-24 | Discharge: 2015-12-24 | Disposition: A | Discharge status: Home / Self Care | Payer: Medicare Other | Source: Ambulatory Visit | Attending: Internal Medicine | Admitting: Internal Medicine

## 2015-12-24 DIAGNOSIS — D242 Benign neoplasm of left breast: Secondary | ICD-10-CM | POA: Diagnosis not present

## 2015-12-24 DIAGNOSIS — R928 Other abnormal and inconclusive findings on diagnostic imaging of breast: Secondary | ICD-10-CM | POA: Diagnosis not present

## 2015-12-24 DIAGNOSIS — Z1231 Encounter for screening mammogram for malignant neoplasm of breast: Secondary | ICD-10-CM

## 2015-12-31 DIAGNOSIS — I25759 Atherosclerosis of native coronary artery of transplanted heart with unspecified angina pectoris: Secondary | ICD-10-CM | POA: Diagnosis not present

## 2016-01-04 ENCOUNTER — Other Ambulatory Visit: Payer: Self-pay | Admitting: Physician Assistant

## 2016-01-04 DIAGNOSIS — I25110 Atherosclerotic heart disease of native coronary artery with unstable angina pectoris: Secondary | ICD-10-CM

## 2016-01-12 ENCOUNTER — Telehealth (HOSPITAL_COMMUNITY): Payer: Self-pay

## 2016-01-12 ENCOUNTER — Encounter
Admission: RE | Admit: 2016-01-12 | Discharge: 2016-01-12 | Disposition: A | Discharge status: Home / Self Care | Payer: Medicare Other | Source: Ambulatory Visit | Attending: Physician Assistant | Admitting: Physician Assistant

## 2016-01-12 DIAGNOSIS — I25110 Atherosclerotic heart disease of native coronary artery with unstable angina pectoris: Secondary | ICD-10-CM | POA: Diagnosis not present

## 2016-01-12 LAB — NM MYOCAR MULTI W/SPECT W/WALL MOTION / EF
CHL CUP MPHR: 162 {beats}/min
CHL CUP RESTING HR STRESS: 54 {beats}/min
CSEPHR: 95 %
Estimated workload: 6.1 METS
Exercise duration (min): 4 min
Exercise duration (sec): 15 s
LV dias vol: 40 mL (ref 46–106)
LV sys vol: 12 mL
Peak HR: 155 {beats}/min
SDS: 0
SRS: 0
SSS: 0
TID: 0.66

## 2016-01-12 MED ORDER — TECHNETIUM TC 99M TETROFOSMIN IV KIT
12.1600 | PACK | Freq: Once | INTRAVENOUS | Status: AC | PRN
  Administered 2016-01-12: 1216 via INTRAVENOUS

## 2016-01-12 MED ORDER — TECHNETIUM TC 99M TETROFOSMIN IV KIT
30.1700 | PACK | Freq: Once | INTRAVENOUS | Status: AC | PRN
  Administered 2016-01-12: 30.17 via INTRAVENOUS

## 2016-01-12 NOTE — Telephone Encounter (Signed)
01/12/16 kfg l/m to call back and schedule appt

## 2016-01-12 NOTE — Telephone Encounter (Signed)
Lea ( [facility]) l/m to call back and schedule appt

## 2016-01-17 DIAGNOSIS — R079 Chest pain, unspecified: Secondary | ICD-10-CM | POA: Diagnosis not present

## 2016-02-01 DIAGNOSIS — Z0001 Encounter for general adult medical examination with abnormal findings: Secondary | ICD-10-CM | POA: Diagnosis not present

## 2016-02-17 DIAGNOSIS — R079 Chest pain, unspecified: Secondary | ICD-10-CM | POA: Diagnosis not present

## 2016-03-07 DIAGNOSIS — G43011 Migraine without aura, intractable, with status migrainosus: Secondary | ICD-10-CM | POA: Diagnosis not present

## 2016-03-07 DIAGNOSIS — Z79891 Long term (current) use of opiate analgesic: Secondary | ICD-10-CM | POA: Diagnosis not present

## 2016-03-07 DIAGNOSIS — G894 Chronic pain syndrome: Secondary | ICD-10-CM | POA: Diagnosis not present

## 2016-03-07 DIAGNOSIS — Z5181 Encounter for therapeutic drug level monitoring: Secondary | ICD-10-CM | POA: Diagnosis not present

## 2016-03-07 DIAGNOSIS — G44221 Chronic tension-type headache, intractable: Secondary | ICD-10-CM | POA: Diagnosis not present

## 2016-03-14 ENCOUNTER — Encounter: Payer: Self-pay | Admitting: Psychiatry

## 2016-03-14 ENCOUNTER — Ambulatory Visit (INDEPENDENT_AMBULATORY_CARE_PROVIDER_SITE_OTHER): Payer: Medicare Other | Admitting: Psychiatry

## 2016-03-14 VITALS — BP 122/76 | HR 76 | Ht 64.0 in | Wt 130.2 lb

## 2016-03-14 DIAGNOSIS — I25110 Atherosclerotic heart disease of native coronary artery with unstable angina pectoris: Secondary | ICD-10-CM | POA: Diagnosis not present

## 2016-03-14 DIAGNOSIS — F331 Major depressive disorder, recurrent, moderate: Secondary | ICD-10-CM | POA: Diagnosis not present

## 2016-03-14 MED ORDER — PAROXETINE HCL 10 MG PO TABS: 10.0000 mg | ORAL_TABLET | Freq: Every day | ORAL | 1 refills | Status: DC

## 2016-03-14 MED ORDER — PAROXETINE HCL 40 MG PO TABS: 40.0000 mg | ORAL_TABLET | ORAL | 1 refills | Status: DC

## 2016-03-14 NOTE — Progress Notes (Signed)
Psychiatric Initial Adult Assessment   Patient Identification: Glenda [name] MRN:  [id number] Date of Evaluation:  03/14/2016 Referral Source: Leta Baptist PA Chief Complaint:   Visit Diagnosis:    ICD-9-CM ICD-10-CM   1. MDD (major depressive disorder), recurrent episode, moderate (HCC) 296.32 F33.1     History of Present Illness:  Patient is a 60 year old Caucasian woman with a long history of depression who was referred by her primary care provider. Patient reports that she is been taking Paxil for a long time and more recently it has not been helping with her depression. Patient reports that she's been on multiple medications in the past with the significant side effects. She has also had 2 hospitalizations in the past for depression. Currently she is endorsing depressed mood and having trouble sleeping. She is also taking Paxil at 40 mg daily. Patient medication records show that she is taking Klonopin 1 mg 3 times daily and the Percocet every 6 hours daily along with OxyContin every 12 hours. Patient educated that the combination of these medications could lead to increased [facility] mortality. Patient reports that she is unaware of this and then states that her daughter has had problems with opioid addiction and is currently doing better and living at their house. She also reports that.this is lot of psychiatric illness in her family and her father died of suicide 5 years ago by gunshot. Patient reports that currently she is not taking as much Klonopin and has only 4 pills left. States that she has been tapering off the Klonopin. Given her losses she states that she is not seeing a therapist currently but would like to see someone. Patient denies any psychotic symptoms. He denies use of alcohol or other illegal substances. She denies any suicidal thoughts.  Associated Signs/Symptoms: Depression Symptoms:  depressed mood, anhedonia, insomnia, (Hypo) Manic Symptoms:  denies Anxiety  Symptoms:  Excessive Worry, Psychotic Symptoms:  denies PTSD Symptoms: denies  Past Psychiatric History: hospitalized at [facility] psychiatric unit in 2014, in 2007  Previous Psychotropic Medications: Yes  Prozac for  A long time, cymbalta (could not urinate), wellbutrin (manic symptoms  Substance Abuse History in the last 12 months:  No.  Consequences of Substance Abuse: Negative  Past Medical History:  Past Medical History:  Diagnosis Date  . Abdominal pain, unspecified site    occasional  . Anxiety state, unspecified   . Cervicalgia   . Chiari malformation   . Chronic airway obstruction, not elsewhere classified   . Chronic headache 02/26/2015  . Chronic headaches    Migraines  . Chronic periodontitis   . Depressive disorder, not elsewhere classified   . Difficulty sleeping   . Dizziness   . Esophageal reflux   . Gastroparesis   . Lung infection Dec 2014  . Osteoporosis   . Other and unspecified hyperlipidemia   . PONV (postoperative nausea and vomiting)   . Pseudotumor cerebri   . Swelling of ankle    bilateral  . Unspecified essential hypertension    no meds currently for HBP - has been on BP meds in past    Past Surgical History:  Procedure Laterality Date  . BREAST BIOPSY Left 05/2014   neg  . BREAST BIOPSY Left 2005   neg  . Chiari Malformation Surgery  2009   [place]  . CHOLECYSTECTOMY    . LAPAROSCOPIC CHOLECYSTECTOMY  06/2013   [facility]  . MULTIPLE EXTRACTIONS WITH ALVEOLOPLASTY N/A 11/21/2013   Procedure: Extraction of tooth #'s 2,6,7,8,9,10,11,21,22,23,24,25,26,27,28,29, 32  with alveoloplasty and bilatreal mandibular tori reductions;  Surgeon: Lenn Cal, DDS;  Location: [facility];  Service: Oral Surgery;  Laterality: N/A;  . TOTAL ABDOMINAL HYSTERECTOMY  1995   [facility]    Family Psychiatric History:   Family History:  Family History  Problem Relation Age of Onset  . Diabetes Mother     type II  . Cancer Mother     uterine  . GER disease Mother    . Osteoarthritis Mother   . Breast cancer Mother 1  . GER disease Father   . Stroke Sister   . Cancer Maternal Grandmother   . Cancer Maternal Grandfather   . Heart disease Paternal Grandmother   . Hematuria Paternal Grandmother   . Heart disease Paternal Grandfather   . Hematuria Brother   . Urolithiasis Brother   . Kidney cancer Maternal Uncle   . Breast cancer Maternal Aunt 30    Social History:   Social History   Social History  . Marital status: Married    Spouse name: N/A  . Number of children: 2  . Years of education: HS   Occupational History  .      Disabled   Social History Main Topics  . Smoking status: Current Every Day Smoker    Packs/day: 0.75    Years: 40.00    Types: Cigarettes  . Smokeless tobacco: Never Used  . Alcohol use No     Comment: Denies  . Drug use: No  . Sexual activity: Yes    Partners: Male    Birth control/ protection: Post-menopausal, Surgical   Other Topics Concern  . None   Social History Narrative   Patient drinks 8 cups of caffeine daily.   Patient is right handed.     Additional Social History: Patient lives with her husband and her daughter and 2 grandchildren. States that she has a good marriage and her husband is a Theatre manager. States that she has a lot of pain from her migraines and a Chiari malformation. States that her bodies in constant pain and even with all the pain medication that she is taking she does have pain.  Allergies:   Allergies  Allergen Reactions  . Cymbalta [Duloxetine Hcl] Other (See Comments)    Affected kidney function  . Doxycycline Itching  . Fentanyl Other (See Comments)    "made me hyper"  . Gabapentin     Patient states it "put her in another world"  . Lyrica [Pregabalin]     Patient states it "put her in another world"  . Morphine And Related Other (See Comments)    "chest pain"    Metabolic Disorder Labs: No results found for: HGBA1C, MPG No results found for:  PROLACTIN No results found for: CHOL, TRIG, HDL, CHOLHDL, VLDL, LDLCALC   Current Medications: Current Outpatient Prescriptions  Medication Sig Dispense Refill  . acetaminophen (TYLENOL) 500 MG tablet Take 1,000 mg by mouth every 6 (six) hours as needed for mild pain or moderate pain.    . clonazePAM (KLONOPIN) 1 MG tablet Take 1 mg by mouth 3 (three) times daily as needed for anxiety.     . cyanocobalamin (,VITAMIN B-12,) 1000 MCG/ML injection Inject 1,000 mcg into the muscle every 30 (thirty) days. Patient takes on the 17th of the month.    . dexlansoprazole (DEXILANT) 60 MG capsule Take 60 mg by mouth daily.    Marland Kitchen estrogens, conjugated, (PREMARIN) 0.3 MG tablet Take 0.3 mg by mouth daily.     Marland Kitchen  fluticasone (VERAMYST) 27.5 MCG/SPRAY nasal spray Place 2 sprays into the nose daily as needed for allergies.     Marland Kitchen levothyroxine (SYNTHROID, LEVOTHROID) 50 MCG tablet Take 50 mcg by mouth daily before breakfast.    . LINZESS 290 MCG CAPS capsule Take 290 mcg by mouth daily.    . meclizine (ANTIVERT) 25 MG tablet Take 25 mg by mouth 3 (three) times daily as needed for dizziness.    . OxyCODONE (OXYCONTIN) 20 mg T12A 12 hr tablet Take 20 mg by mouth every 12 (twelve) hours.    Marland Kitchen oxyCODONE-acetaminophen (PERCOCET) 10-325 MG per tablet Take 1 tablet by mouth every 6 (six) hours as needed for pain.    Marland Kitchen PARoxetine (PAXIL) 10 MG tablet Take 1 tablet (10 mg total) by mouth daily. 30 tablet 1  . PARoxetine (PAXIL) 40 MG tablet Take 1 tablet (40 mg total) by mouth every morning. 30 tablet 1  . promethazine (PHENERGAN) 25 MG tablet Take 25 mg by mouth every 6 (six) hours as needed for nausea or vomiting.    . propranolol ER (INDERAL LA) 60 MG 24 hr capsule Take 60 mg by mouth daily.    Marland Kitchen tiZANidine (ZANAFLEX) 4 MG tablet Take 4 mg by mouth every 8 (eight) hours as needed for muscle spasms.     No current facility-administered medications for this visit.     Neurologic: Headache: Yes Seizure:  No Paresthesias:No  Musculoskeletal: Strength & Muscle Tone: within normal limits Gait & Station: normal Patient leans: N/A  Psychiatric Specialty Exam: ROS  Blood pressure 122/76, pulse 76, height '5\' 4"'$  (1.626 m), weight 130 lb 3.2 oz (59.1 kg), SpO2 98 %.Body mass index is 22.35 kg/m.  General Appearance: Casual  Eye Contact:  Fair  Speech:  Clear and Coherent  Volume:  Normal  Mood:  Depressed  Affect:  Constricted  Thought Process:  Coherent  Orientation:  Full (Time, Place, and Person)  Thought Content:  Logical  Suicidal Thoughts:  No  Homicidal Thoughts:  No  Memory:  Immediate;   Fair Recent;   Fair Remote;   Fair  Judgement:  Fair  Insight:  Fair  Psychomotor Activity:  Normal  Concentration:  Concentration: Fair and Attention Span: Fair  Recall:  [employer] of Knowledge:Fair  Language: Fair  Akathisia:  No  Handed:  Right  AIMS (if indicated):  na  Assets:  Communication Skills Desire for Improvement Social Support Vocational/Educational  ADL's:  Intact  Cognition: WNL  Sleep:  poor    Treatment Plan Summary:  Major depressive disorder moderate Increase Paxil to 50 mg once daily. Patient to start seeing a therapist here at this office.  Generalized anxiety disorder Same as above Patient educated about interactions between benzodiazepines and opiate medications. Discussed with patient that there is a high risk of mortality with this combination. Also discussed with patient that benzodiazepines are not a long-term treatment for anxiety Patient reports that she's been tapering off the benzodiazepines and has only 4 pills of the Klonopin left.  Return to clinic in 1 month's time or call before if needed    Coolidge Gossard, Blair Dolphin, MD 10/30/20172:18 PM

## 2016-03-18 ENCOUNTER — Ambulatory Visit: Payer: Medicare Other | Admitting: Licensed Clinical Social Worker

## 2016-03-28 ENCOUNTER — Ambulatory Visit: Payer: Medicare Other | Admitting: Psychiatry

## 2016-03-30 ENCOUNTER — Ambulatory Visit: Payer: Medicare Other | Admitting: Licensed Clinical Social Worker

## 2016-03-31 DIAGNOSIS — F331 Major depressive disorder, recurrent, moderate: Secondary | ICD-10-CM | POA: Diagnosis not present

## 2016-03-31 DIAGNOSIS — I10 Essential (primary) hypertension: Secondary | ICD-10-CM | POA: Diagnosis not present

## 2016-03-31 DIAGNOSIS — Z23 Encounter for immunization: Secondary | ICD-10-CM | POA: Diagnosis not present

## 2016-03-31 DIAGNOSIS — R079 Chest pain, unspecified: Secondary | ICD-10-CM | POA: Diagnosis not present

## 2016-03-31 DIAGNOSIS — F17210 Nicotine dependence, cigarettes, uncomplicated: Secondary | ICD-10-CM | POA: Diagnosis not present

## 2016-03-31 DIAGNOSIS — F411 Generalized anxiety disorder: Secondary | ICD-10-CM | POA: Diagnosis not present

## 2016-05-27 DIAGNOSIS — G43011 Migraine without aura, intractable, with status migrainosus: Secondary | ICD-10-CM | POA: Diagnosis not present

## 2016-05-27 DIAGNOSIS — Z79891 Long term (current) use of opiate analgesic: Secondary | ICD-10-CM | POA: Diagnosis not present

## 2016-05-27 DIAGNOSIS — G894 Chronic pain syndrome: Secondary | ICD-10-CM | POA: Diagnosis not present

## 2016-05-27 DIAGNOSIS — G44221 Chronic tension-type headache, intractable: Secondary | ICD-10-CM | POA: Diagnosis not present

## 2016-09-05 DIAGNOSIS — Z87891 Personal history of nicotine dependence: Secondary | ICD-10-CM | POA: Diagnosis not present

## 2016-09-05 DIAGNOSIS — F17200 Nicotine dependence, unspecified, uncomplicated: Secondary | ICD-10-CM | POA: Diagnosis not present

## 2016-09-05 DIAGNOSIS — G894 Chronic pain syndrome: Secondary | ICD-10-CM | POA: Diagnosis not present

## 2016-09-05 DIAGNOSIS — Z720 Tobacco use: Secondary | ICD-10-CM | POA: Diagnosis not present

## 2016-09-05 DIAGNOSIS — G43011 Migraine without aura, intractable, with status migrainosus: Secondary | ICD-10-CM | POA: Diagnosis not present

## 2016-09-05 DIAGNOSIS — Z79891 Long term (current) use of opiate analgesic: Secondary | ICD-10-CM | POA: Diagnosis not present

## 2016-09-05 DIAGNOSIS — G44221 Chronic tension-type headache, intractable: Secondary | ICD-10-CM | POA: Diagnosis not present

## 2016-09-05 DIAGNOSIS — Z716 Tobacco abuse counseling: Secondary | ICD-10-CM | POA: Diagnosis not present

## 2016-10-04 ENCOUNTER — Ambulatory Visit
Admission: RE | Admit: 2016-10-04 | Discharge: 2016-10-04 | Disposition: A | Discharge status: Home / Self Care | Payer: Medicare Other | Source: Ambulatory Visit | Attending: Internal Medicine | Admitting: Internal Medicine

## 2016-10-04 ENCOUNTER — Other Ambulatory Visit: Payer: Self-pay | Admitting: Internal Medicine

## 2016-10-04 DIAGNOSIS — R42 Dizziness and giddiness: Secondary | ICD-10-CM | POA: Diagnosis not present

## 2016-10-04 DIAGNOSIS — R0789 Other chest pain: Secondary | ICD-10-CM

## 2016-10-04 DIAGNOSIS — R911 Solitary pulmonary nodule: Secondary | ICD-10-CM | POA: Insufficient documentation

## 2016-10-04 DIAGNOSIS — M816 Localized osteoporosis [Lequesne]: Secondary | ICD-10-CM | POA: Diagnosis not present

## 2016-10-04 DIAGNOSIS — H6692 Otitis media, unspecified, left ear: Secondary | ICD-10-CM | POA: Diagnosis not present

## 2016-10-04 DIAGNOSIS — J90 Pleural effusion, not elsewhere classified: Secondary | ICD-10-CM | POA: Diagnosis not present

## 2016-10-04 DIAGNOSIS — R0781 Pleurodynia: Secondary | ICD-10-CM | POA: Diagnosis not present

## 2016-10-04 DIAGNOSIS — F411 Generalized anxiety disorder: Secondary | ICD-10-CM | POA: Diagnosis not present

## 2016-10-04 DIAGNOSIS — R079 Chest pain, unspecified: Secondary | ICD-10-CM | POA: Diagnosis not present

## 2016-11-23 DIAGNOSIS — R51 Headache: Secondary | ICD-10-CM | POA: Diagnosis not present

## 2016-11-23 DIAGNOSIS — Z79899 Other long term (current) drug therapy: Secondary | ICD-10-CM | POA: Diagnosis not present

## 2016-11-23 DIAGNOSIS — Z7689 Persons encountering health services in other specified circumstances: Secondary | ICD-10-CM | POA: Diagnosis not present

## 2016-11-23 DIAGNOSIS — F411 Generalized anxiety disorder: Secondary | ICD-10-CM | POA: Diagnosis not present

## 2016-11-23 DIAGNOSIS — M542 Cervicalgia: Secondary | ICD-10-CM | POA: Diagnosis not present

## 2016-12-20 DIAGNOSIS — F17200 Nicotine dependence, unspecified, uncomplicated: Secondary | ICD-10-CM | POA: Diagnosis not present

## 2016-12-20 DIAGNOSIS — G43011 Migraine without aura, intractable, with status migrainosus: Secondary | ICD-10-CM | POA: Diagnosis not present

## 2016-12-20 DIAGNOSIS — Z79891 Long term (current) use of opiate analgesic: Secondary | ICD-10-CM | POA: Diagnosis not present

## 2016-12-20 DIAGNOSIS — G894 Chronic pain syndrome: Secondary | ICD-10-CM | POA: Diagnosis not present

## 2016-12-20 DIAGNOSIS — Z87891 Personal history of nicotine dependence: Secondary | ICD-10-CM | POA: Diagnosis not present

## 2016-12-20 DIAGNOSIS — Z720 Tobacco use: Secondary | ICD-10-CM | POA: Diagnosis not present

## 2016-12-20 DIAGNOSIS — Z716 Tobacco abuse counseling: Secondary | ICD-10-CM | POA: Diagnosis not present

## 2016-12-20 DIAGNOSIS — G44221 Chronic tension-type headache, intractable: Secondary | ICD-10-CM | POA: Diagnosis not present

## 2017-02-22 DIAGNOSIS — R825 Elevated urine levels of drugs, medicaments and biological substances: Secondary | ICD-10-CM | POA: Diagnosis not present

## 2017-02-22 DIAGNOSIS — F411 Generalized anxiety disorder: Secondary | ICD-10-CM | POA: Diagnosis not present

## 2017-02-22 DIAGNOSIS — Z23 Encounter for immunization: Secondary | ICD-10-CM | POA: Diagnosis not present

## 2017-02-22 DIAGNOSIS — Z79899 Other long term (current) drug therapy: Secondary | ICD-10-CM | POA: Diagnosis not present

## 2017-02-22 DIAGNOSIS — R5383 Other fatigue: Secondary | ICD-10-CM | POA: Diagnosis not present

## 2017-03-13 DIAGNOSIS — R5383 Other fatigue: Secondary | ICD-10-CM | POA: Diagnosis not present

## 2017-03-13 DIAGNOSIS — F411 Generalized anxiety disorder: Secondary | ICD-10-CM | POA: Diagnosis not present

## 2017-03-13 DIAGNOSIS — H6091 Unspecified otitis externa, right ear: Secondary | ICD-10-CM | POA: Diagnosis not present

## 2017-03-17 DIAGNOSIS — Z79891 Long term (current) use of opiate analgesic: Secondary | ICD-10-CM | POA: Diagnosis not present

## 2017-03-17 DIAGNOSIS — G43011 Migraine without aura, intractable, with status migrainosus: Secondary | ICD-10-CM | POA: Diagnosis not present

## 2017-03-17 DIAGNOSIS — G44221 Chronic tension-type headache, intractable: Secondary | ICD-10-CM | POA: Diagnosis not present

## 2017-03-17 DIAGNOSIS — G894 Chronic pain syndrome: Secondary | ICD-10-CM | POA: Diagnosis not present

## 2017-03-17 DIAGNOSIS — Z5181 Encounter for therapeutic drug level monitoring: Secondary | ICD-10-CM | POA: Diagnosis not present

## 2017-03-17 DIAGNOSIS — F17200 Nicotine dependence, unspecified, uncomplicated: Secondary | ICD-10-CM | POA: Diagnosis not present

## 2017-03-17 DIAGNOSIS — Z716 Tobacco abuse counseling: Secondary | ICD-10-CM | POA: Diagnosis not present

## 2017-03-17 DIAGNOSIS — Z720 Tobacco use: Secondary | ICD-10-CM | POA: Diagnosis not present

## 2017-03-17 DIAGNOSIS — Z87891 Personal history of nicotine dependence: Secondary | ICD-10-CM | POA: Diagnosis not present

## 2017-05-18 ENCOUNTER — Other Ambulatory Visit: Payer: Self-pay | Admitting: Internal Medicine

## 2017-07-20 ENCOUNTER — Other Ambulatory Visit: Payer: Self-pay | Admitting: Family Medicine

## 2017-07-20 ENCOUNTER — Ambulatory Visit
Admission: RE | Admit: 2017-07-20 | Discharge: 2017-07-20 | Disposition: A | Discharge status: Home / Self Care | Payer: Medicare Other | Source: Ambulatory Visit | Attending: Family Medicine | Admitting: Family Medicine

## 2017-07-20 DIAGNOSIS — M542 Cervicalgia: Secondary | ICD-10-CM | POA: Insufficient documentation

## 2017-07-20 DIAGNOSIS — M47812 Spondylosis without myelopathy or radiculopathy, cervical region: Secondary | ICD-10-CM | POA: Insufficient documentation

## 2017-11-13 ENCOUNTER — Ambulatory Visit (INDEPENDENT_AMBULATORY_CARE_PROVIDER_SITE_OTHER): Payer: Medicare Other | Admitting: Neurology

## 2017-11-13 ENCOUNTER — Encounter: Payer: Self-pay | Admitting: Neurology

## 2017-11-13 VITALS — BP 144/85 | HR 64 | Ht 64.0 in | Wt 135.0 lb

## 2017-11-13 DIAGNOSIS — R519 Headache, unspecified: Secondary | ICD-10-CM

## 2017-11-13 DIAGNOSIS — R51 Headache: Secondary | ICD-10-CM

## 2017-11-13 DIAGNOSIS — M79602 Pain in left arm: Secondary | ICD-10-CM

## 2017-11-13 DIAGNOSIS — G43711 Chronic migraine without aura, intractable, with status migrainosus: Secondary | ICD-10-CM | POA: Diagnosis not present

## 2017-11-13 HISTORY — DX: Chronic migraine without aura, intractable, with status migrainosus: G43.711

## 2017-11-13 NOTE — Progress Notes (Signed)
Reason for visit: Headache  Referring physician: Dr. [name] is a 62 y.o. female  History of present illness:  Glenda [name] is a 62 year old right-handed white female with a history of chronic daily headaches since she was 64 or 62 years old.  The patient does not remember the last day she had without a headache.  She was seen here 4 years ago, she was placed on tizanidine, but she never came back for follow-up.  The patient has had a lot of neck discomfort, MRI of the cervical spine done in 2016 was relatively unremarkable, mild arthritic changes were noted.  The patient does report a lot of neck stiffness, she is quite limited in her ability to flex and extend the neck and to turn the head.  Within the last 5 months she has developed pain and tingling sensations down the left arm with involving the fourth and fifth fingers.  The patient is in [facility], she is getting oxycodone on a regular basis daily for her headaches.  She drinks 2 Cokes daily, she drinks tea with lunch and dinner and occasionally she will drink coffee.  The patient does report nausea vomiting, photophobia and phonophobia with the headache.  The headaches start in the neck and go up to the back of the head.  The patient denies any weakness of the extremities with exception that she feels that the legs are slightly weak.  She denies any trouble controlling the bowels or the bladder.  She has undergone chiropractic treatments in the past.  She has been subjected to surgery for Arnold-Chiari malformation but she does not have significant issues in this regard.  She has been told she has pseudotumor cerebri, yet examinations in the past have shown good venous pulsations of the eyes.  The patient has been on Topamax, tizanidine, Cymbalta, gabapentin, Lyrica, propranolol, Paxil, and clonazepam without benefit.  The patient returns for an evaluation.  Past Medical History:  Diagnosis Date  .  Abdominal pain, unspecified site    occasional  . Anxiety state, unspecified   . Cervicalgia   . Chiari malformation   . Chronic airway obstruction, not elsewhere classified   . Chronic headache 02/26/2015  . Chronic headaches    Migraines  . Chronic migraine without aura, with intractable migraine, so stated, with status migrainosus 11/13/2017  . Chronic periodontitis   . Depressive disorder, not elsewhere classified   . Difficulty sleeping   . Dizziness   . Esophageal reflux   . Gastroparesis   . Lung infection Dec 2014  . Osteoporosis   . Other and unspecified hyperlipidemia   . PONV (postoperative nausea and vomiting)   . Pseudotumor cerebri   . Swelling of ankle    bilateral  . Unspecified essential hypertension    no meds currently for HBP - has been on BP meds in past    Past Surgical History:  Procedure Laterality Date  . BREAST BIOPSY Left 05/2014   neg  . BREAST BIOPSY Left 2005   neg  . Chiari Malformation Surgery  2009   [place]  . CHOLECYSTECTOMY    . LAPAROSCOPIC CHOLECYSTECTOMY  06/2013   [facility]  . MULTIPLE EXTRACTIONS WITH ALVEOLOPLASTY N/A 11/21/2013   Procedure: Extraction of tooth #'s 2,6,7,8,9,10,11,21,22,23,24,25,26,27,28,29, 32 with alveoloplasty and bilatreal mandibular tori reductions;  Surgeon: Lenn Cal, DDS;  Location: [facility];  Service: Oral Surgery;  Laterality: N/A;  . [place]  Family History  Problem Relation Age of Onset  . Diabetes Mother        type II  . Cancer Mother        uterine  . GER disease Mother   . Osteoarthritis Mother   . Breast cancer Mother 34  . GER disease Father   . Stroke Sister   . Multiple sclerosis Sister   . Cancer Maternal Grandmother   . Cancer Maternal Grandfather   . Heart disease Paternal Grandmother   . Hematuria Paternal Grandmother   . Heart disease Paternal Grandfather   . Hematuria Brother   . Urolithiasis Brother   . Heart disease Brother   . Kidney  cancer Maternal Uncle   . Breast cancer Maternal Aunt 30    Social history:  reports that she has been smoking cigarettes.  She has a 20.00 pack-year smoking history. She has never used smokeless tobacco. She reports that she does not drink alcohol or use drugs.  Medications:  Prior to Admission medications   Medication Sig Start Date End Date Taking? Authorizing Provider  acetaminophen (TYLENOL) 500 MG tablet Take 1,000 mg by mouth every 6 (six) hours as needed for mild pain or moderate pain.   Yes [provider]  clonazePAM (KLONOPIN) 1 MG tablet Take 1 mg by mouth 2 (two) times daily as needed for anxiety.    Yes [provider]  cyanocobalamin (,VITAMIN B-12,) 1000 MCG/ML injection Inject 1,000 mcg into the muscle every 30 (thirty) days. Patient takes on the 17th of the month.   Yes [provider]  dexlansoprazole (DEXILANT) 60 MG capsule Take 60 mg by mouth daily.   Yes [provider]  estrogens, conjugated, (PREMARIN) 0.3 MG tablet Take 0.3 mg by mouth daily.    Yes [provider]  LINZESS 290 MCG CAPS capsule Take 290 mcg by mouth daily. 12/13/15  Yes [provider]  meclizine (ANTIVERT) 25 MG tablet Take 25 mg by mouth 3 (three) times daily as needed for dizziness.   Yes [provider]  oxyCODONE (ROXICODONE) 15 MG immediate release tablet Take 15 mg by mouth as needed for pain.   Yes [provider]  oxyCODONE-acetaminophen (PERCOCET) 10-325 MG per tablet Take 1 tablet by mouth every 6 (six) hours as needed for pain.   Yes [provider]  PARoxetine (PAXIL) 40 MG tablet Take 1 tablet (40 mg total) by mouth every morning. 03/14/16  Yes Ravi, Himabindu, MD  promethazine (PHENERGAN) 25 MG tablet Take 25 mg by mouth every 6 (six) hours as needed for nausea or vomiting.   Yes [provider]  propranolol ER (INDERAL LA) 60 MG 24 hr capsule Take 60 mg by mouth daily. 12/13/15  Yes [provider]  tiZANidine (ZANAFLEX) 4 MG tablet Take 4 mg by mouth every 8 (eight) hours as needed for muscle spasms.   Yes [provider]      Allergies  Allergen Reactions  . Cymbalta [Duloxetine Hcl] Other (See Comments)    Affected kidney function  . Doxycycline Itching  . Fentanyl Other (See Comments)    "made me hyper"  . Gabapentin     Patient states it "put her in another world"  . Lyrica [Pregabalin]     Patient states it "put her in another world"  . Morphine And Related Other (See Comments)    "chest pain"    ROS:  Out of a complete 14 system review of symptoms, the patient complains  only of the following symptoms, and all other reviewed systems are negative.  Fatigue Chest pain, swelling in the legs Blurred vision, eye pain Headache, weakness, dizziness, tremor Depression, anxiety, none of sleep, racing thoughts Insomnia  Blood pressure (!) 144/85, pulse 64, height 5\' 4"  (1.626 m), weight 135 lb (61.2 kg).  Physical Exam  General: The patient is alert and cooperative at the time of the examination.  The patient has a flat affect.  Eyes: Pupils are equal, round, and reactive to light. Discs are flat bilaterally.  Neck: The neck is supple, no carotid bruits are noted.  Respiratory: The respiratory examination is clear.  Cardiovascular: The cardiovascular examination reveals a regular rate and rhythm, no obvious murmurs or rubs are noted.  Neuromuscular: The patient lacks about 20 degrees of full lateral rotation of the cervical spine bilaterally.  She is somewhat restricted with flexion extension of the neck as well.  Skin: Extremities are without significant edema.  Neurologic Exam  Mental status: The patient is alert and oriented x 3 at the time of the examination. The patient has apparent normal recent and remote memory, with an apparently normal attention span and concentration ability.  Cranial nerves: Facial symmetry is present. There  is good sensation of the face to pinprick and soft touch bilaterally. The strength of the facial muscles and the muscles to head turning and shoulder shrug are normal bilaterally. Speech is well enunciated, no aphasia or dysarthria is noted. Extraocular movements are full. Visual fields are full. The tongue is midline, and the patient has symmetric elevation of the soft palate. No obvious hearing deficits are noted.  Motor: The motor testing reveals 5 over 5 strength of all 4 extremities. Good symmetric motor tone is noted throughout.  Sensory: Sensory testing is intact to pinprick, soft touch, vibration sensation, and position sense on all 4 extremities. No evidence of extinction is noted.  Coordination: Cerebellar testing reveals good finger-nose-finger and heel-to-shin bilaterally.  Gait and station: Gait is normal. Tandem gait is normal. Romberg is negative. No drift is seen.  Reflexes: Deep tendon reflexes are symmetric and normal bilaterally. Toes are downgoing bilaterally.   Assessment/Plan:  1.  Intractable migraine headache  The patient is having ongoing daily headaches with nausea and vomiting, photophobia and phonophobia consistent with migraine.  Migraine can start in the neck and go up the back of the head.  The patient has a prominent family history of migraine in the paternal grandmother, mother, and daughter.  The patient will be given a trial on Botox.  Given the history of pain down the left arm, we will get nerve conduction studies of both arms, EMG on the left arm.  She will follow-up in 5 months, she will follow-up for the EMG evaluation.  Jill Alexanders MD 11/13/2017 3:41 PM  [facility] [address] [place], [place] [address]  Phone [phone] Fax [phone]

## 2017-11-13 NOTE — Patient Instructions (Signed)
We will get Botox therapy set up.

## 2017-12-26 ENCOUNTER — Encounter: Payer: Medicare Other | Admitting: Neurology

## 2017-12-26 ENCOUNTER — Ambulatory Visit (INDEPENDENT_AMBULATORY_CARE_PROVIDER_SITE_OTHER): Payer: Self-pay | Admitting: Neurology

## 2017-12-26 ENCOUNTER — Ambulatory Visit (INDEPENDENT_AMBULATORY_CARE_PROVIDER_SITE_OTHER): Payer: Medicare Other | Admitting: Neurology

## 2017-12-26 DIAGNOSIS — M79602 Pain in left arm: Secondary | ICD-10-CM | POA: Diagnosis not present

## 2017-12-26 DIAGNOSIS — Z0289 Encounter for other administrative examinations: Secondary | ICD-10-CM

## 2017-12-26 DIAGNOSIS — G8929 Other chronic pain: Secondary | ICD-10-CM

## 2017-12-26 DIAGNOSIS — R51 Headache: Principal | ICD-10-CM

## 2017-12-26 NOTE — Procedures (Signed)
     HISTORY:  Glenda [name] is a 62 year old patient with a history of chronic daily headaches who reports issues with left arm discomfort and tingling into the fourth and fifth fingers of the left hand.  The patient does have some neck discomfort.  She is being evaluated for a possible neuropathy or a cervical radiculopathy.  NERVE CONDUCTION STUDIES:  Nerve conduction studies were performed on both upper extremities. The distal motor latencies and motor amplitudes for the median and ulnar nerves were within normal limits. The nerve conduction velocities for these nerves were also normal. The sensory latencies for the median and ulnar nerves were normal. The F wave latencies for the ulnar nerves were within normal limits.   EMG STUDIES:  EMG study was performed on the left upper extremity:  The first dorsal interosseous muscle reveals 2 to 4 K units with full recruitment. No fibrillations or positive waves were noted. The abductor pollicis brevis muscle reveals 2 to 4 K units with full recruitment. No fibrillations or positive waves were noted. The extensor indicis proprius muscle reveals 1 to 3 K units with full recruitment. No fibrillations or positive waves were noted. The pronator teres muscle reveals 2 to 3 K units with full recruitment. No fibrillations or positive waves were noted. The biceps muscle reveals 1 to 2 K units with full recruitment. No fibrillations or positive waves were noted. The triceps muscle reveals 2 to 4 K units with full recruitment. No fibrillations or positive waves were noted. The anterior deltoid muscle reveals 2 to 3 K units with full recruitment. No fibrillations or positive waves were noted. The cervical paraspinal muscles were tested at 2 levels. No abnormalities of insertional activity were seen at either level tested. There was poor relaxation.   IMPRESSION:  Nerve conduction studies done on both upper extremities were within normal limits.  No  evidence of a neuropathy was seen.  EMG evaluation of the left upper extremity is normal, no evidence of a cervical radiculopathy is seen.  Jill Alexanders MD 12/26/2017 3:34 PM  [facility] [address] [place], [place] [address]  Phone [phone] Fax [phone]

## 2017-12-26 NOTE — Progress Notes (Addendum)
EMG nerve conduction study evaluation done today is normal.  No evidence of a neuropathy or radiculopathy.  The patient claims that the left arm discomfort has improved some recently.  We will follow this clinically, if the pain worsens, we may consider MRI evaluation of the cervical spine in the future.  The patient has not yet heard anything about her Botox treatment for her headache, we will try to find out about scheduling this treatment.      [place]    Nerve / Sites Muscle Latency Ref. Amplitude Ref. Rel Amp Segments Distance Velocity Ref. Area    ms ms mV mV %  cm m/s m/s mVms  R Median - APB     Wrist APB 3.0 ?4.4 8.9 ?4.0 100 Wrist - APB 7   30.7     Upper arm APB 6.6  7.7  86.7 Upper arm - Wrist 20 56 ?49 26.0  L Median - APB     Wrist APB 3.1 ?4.4 8.7 ?4.0 100 Wrist - APB 7   32.0     Upper arm APB 6.6  8.6  98.8 Upper arm - Wrist 20 56 ?49 32.1  R Ulnar - ADM     Wrist ADM 2.7 ?3.3 7.0 ?6.0 100 Wrist - ADM 7   23.7     B.Elbow ADM 5.7  6.0  86.7 B.Elbow - Wrist 18 59 ?49 21.5     A.Elbow ADM 7.7  6.0  99.3 A.Elbow - B.Elbow 10 51 ?49 22.2         A.Elbow - Wrist      L Ulnar - ADM     Wrist ADM 2.7 ?3.3 8.2 ?6.0 100 Wrist - ADM 7   25.9     B.Elbow ADM 6.1  7.3  89.8 B.Elbow - Wrist 18 53 ?49 26.0     A.Elbow ADM 8.0  7.2  98.8 A.Elbow - B.Elbow 10 53 ?49 25.2         A.Elbow - Wrist                 SNC    Nerve / Sites Rec. Site Peak Lat Ref.  Amp Ref. Segments Distance    ms ms V V  cm  R Median - Orthodromic (Dig II, Mid palm)     Dig II Wrist 3.1 ?3.4 34 ?10 Dig II - Wrist 13  L Median - Orthodromic (Dig II, Mid palm)     Dig II Wrist 3.1 ?3.4 26 ?10 Dig II - Wrist 13  R Ulnar - Orthodromic, (Dig V, Mid palm)     Dig V Wrist 2.9 ?3.1 13 ?5 Dig V - Wrist 11  L Ulnar - Orthodromic, (Dig V, Mid palm)     Dig V Wrist 3.0 ?3.1 11 ?5 Dig V - Wrist 47              F  Wave    Nerve F Lat Ref.   ms ms  R Ulnar - ADM 26.7 ?32.0  L Ulnar - ADM 26.4 ?32.0

## 2017-12-27 ENCOUNTER — Telehealth: Payer: Self-pay | Admitting: Neurology

## 2017-12-27 ENCOUNTER — Encounter: Payer: Self-pay | Admitting: Neurology

## 2017-12-27 NOTE — Progress Notes (Signed)
Please refer to EMG and nerve conduction study procedure note. 

## 2017-12-27 NOTE — Telephone Encounter (Signed)
I called to schedule the patient for a botox injection she did not answer so I left a VM asking her to call me back.

## 2018-01-19 ENCOUNTER — Ambulatory Visit: Payer: Medicare Other | Admitting: Neurology

## 2018-01-19 ENCOUNTER — Telehealth: Payer: Self-pay | Admitting: Neurology

## 2018-01-19 NOTE — Telephone Encounter (Signed)
This patient canceled within 24 hours of her Botox therapy appointment.

## 2018-02-02 ENCOUNTER — Ambulatory Visit: Payer: Medicare Other | Admitting: Neurology

## 2018-02-06 ENCOUNTER — Ambulatory Visit: Payer: Medicare Other | Admitting: Neurology

## 2018-02-06 ENCOUNTER — Telehealth: Payer: Self-pay | Admitting: Neurology

## 2018-02-06 NOTE — Telephone Encounter (Signed)
This patient did not show for a Botox therapy session today.  This is the third no-show for this patient, she will be discharged from our practice.

## 2018-02-07 ENCOUNTER — Encounter: Payer: Self-pay | Admitting: Neurology

## 2018-02-12 ENCOUNTER — Encounter: Payer: Self-pay | Admitting: Neurology

## 2018-06-04 ENCOUNTER — Ambulatory Visit: Payer: Medicare Other | Admitting: Neurology

## 2018-07-08 IMAGING — MG MM DIGITAL DIAGNOSTIC BILAT W/ TOMO W/ CAD
9 of 13 series · 9 of 29 positions shown · non-contrast
Comparison: Previous exam(s).

CLINICAL DATA: History of a benign left breast biopsy. Short-term
interval follow-up.

EXAM:
2D DIGITAL DIAGNOSTIC BILATERAL MAMMOGRAM WITH CAD AND ADJUNCT TOMO

[R MLO (1 of 2)]
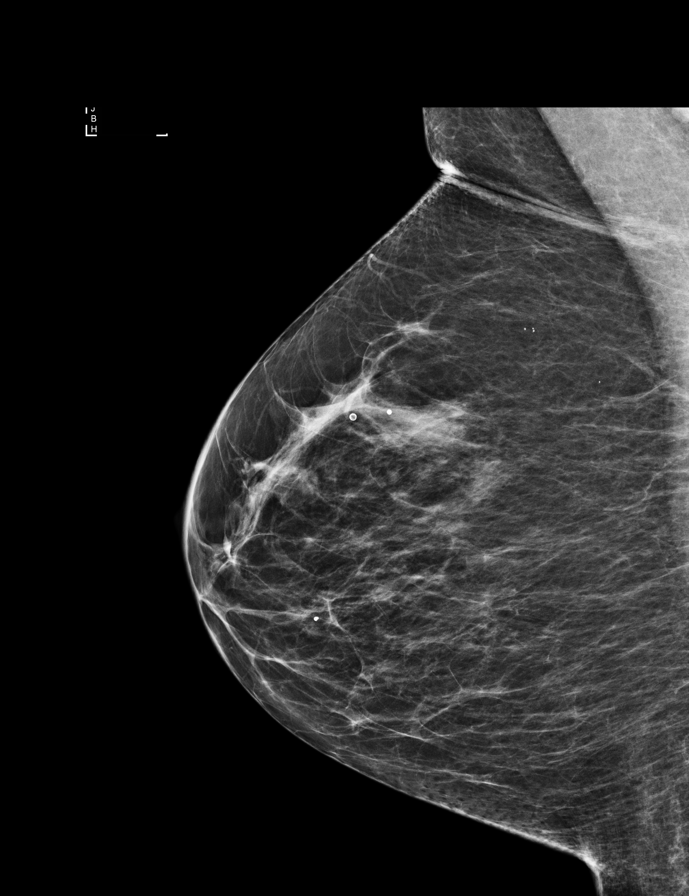

[R CC synth-2D]
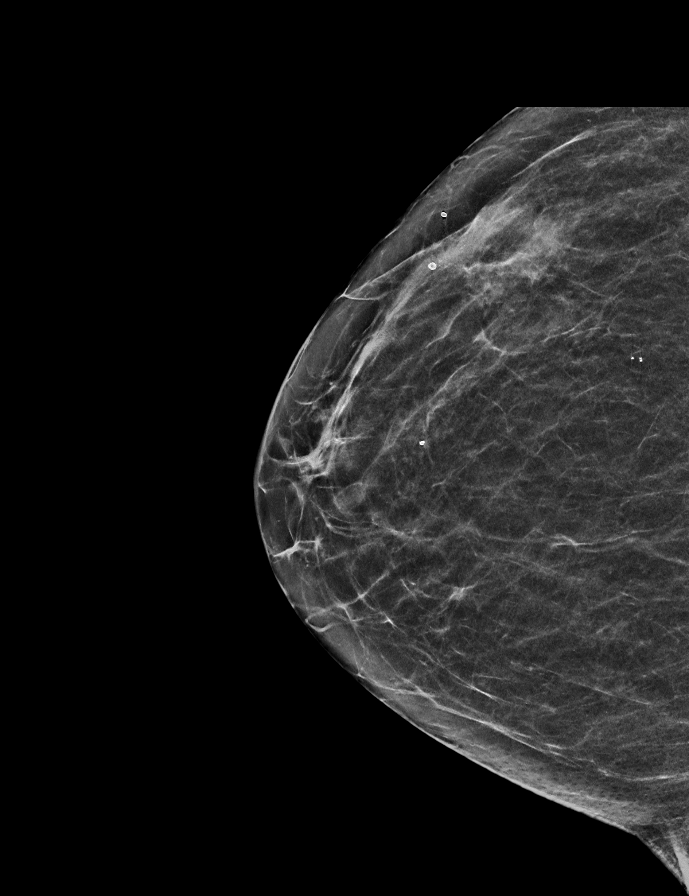

[L MLO synth-2D]
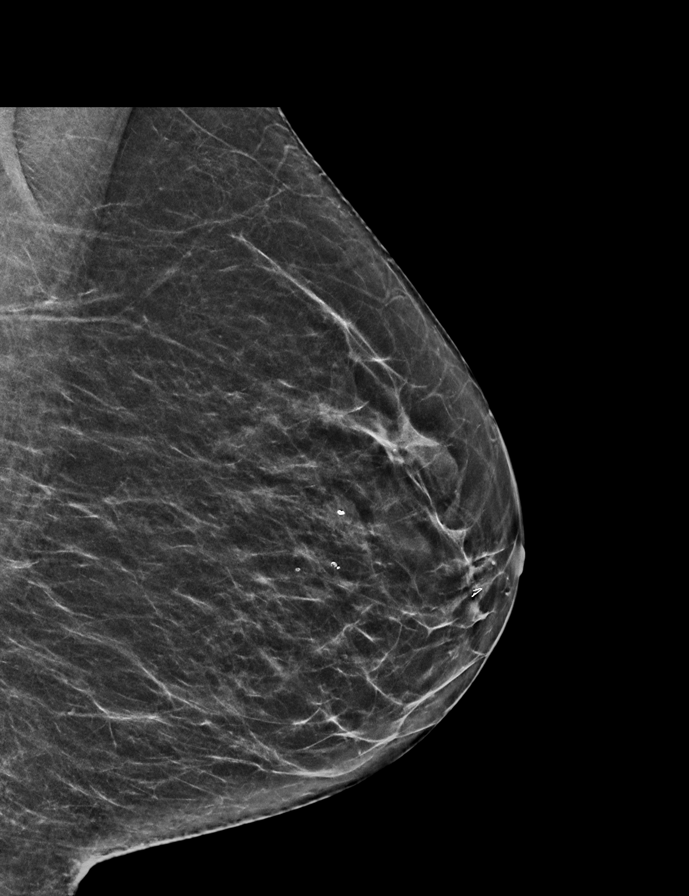

[L MLO]
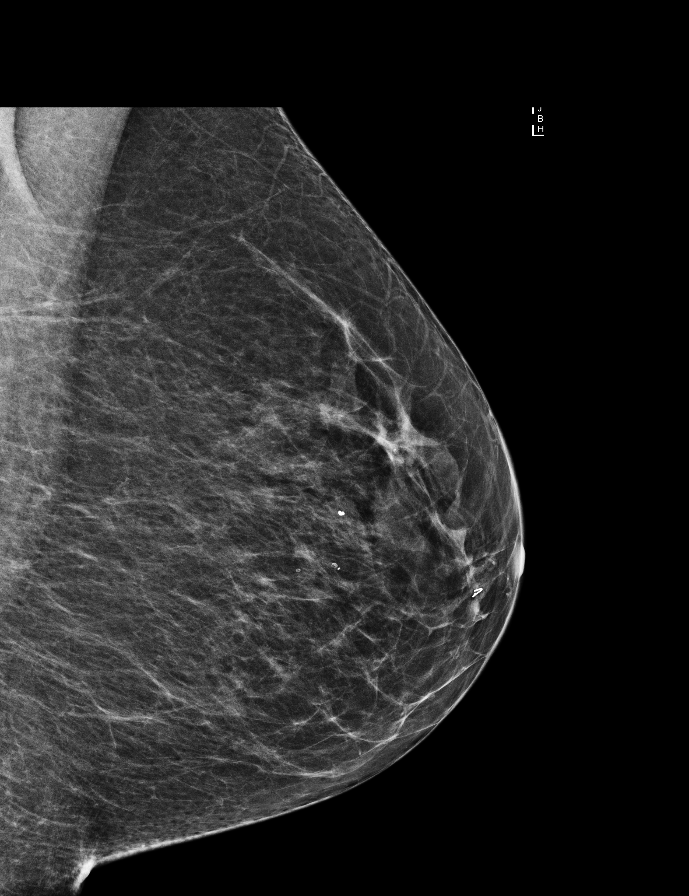

[L CC synth-2D]
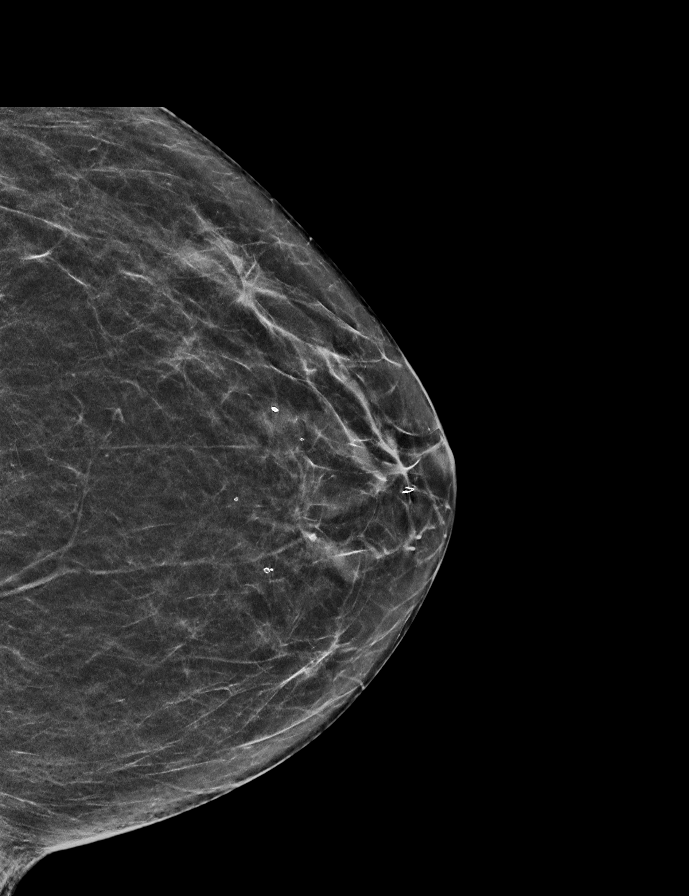

[R MLO synth-2D]
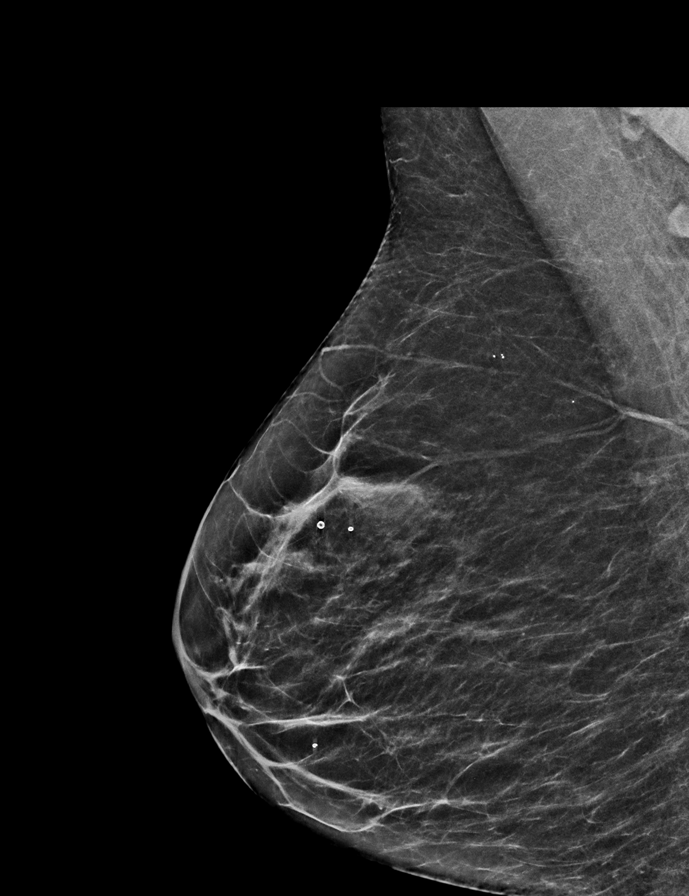

[L CC]
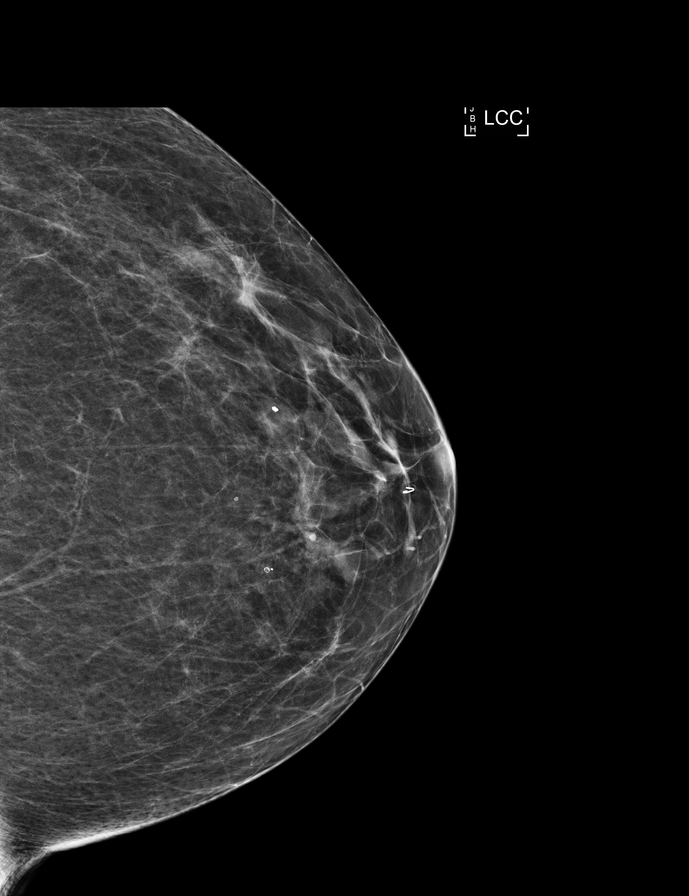

[R MLO (2 of 2)]
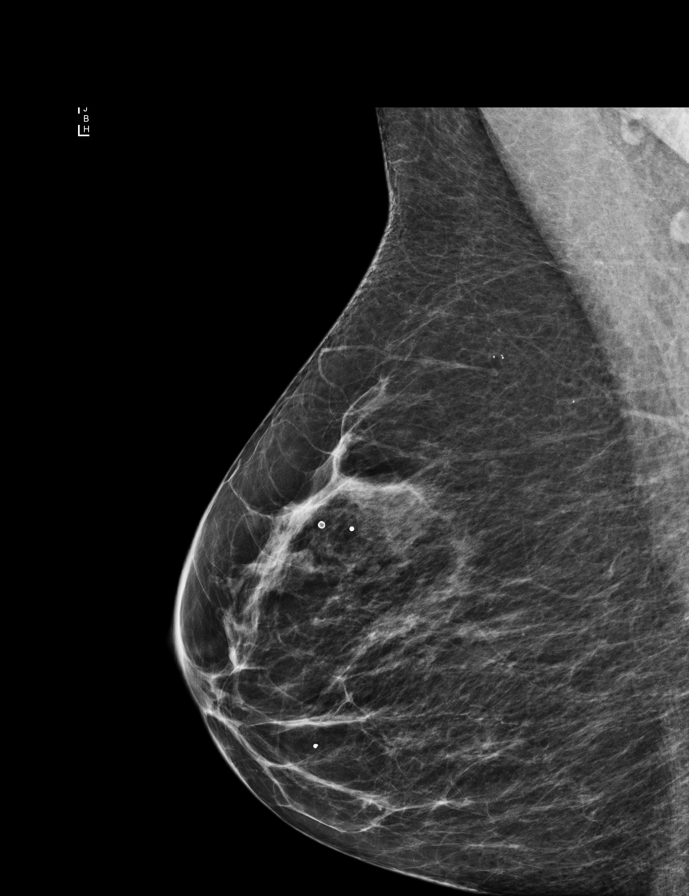

[R CC]
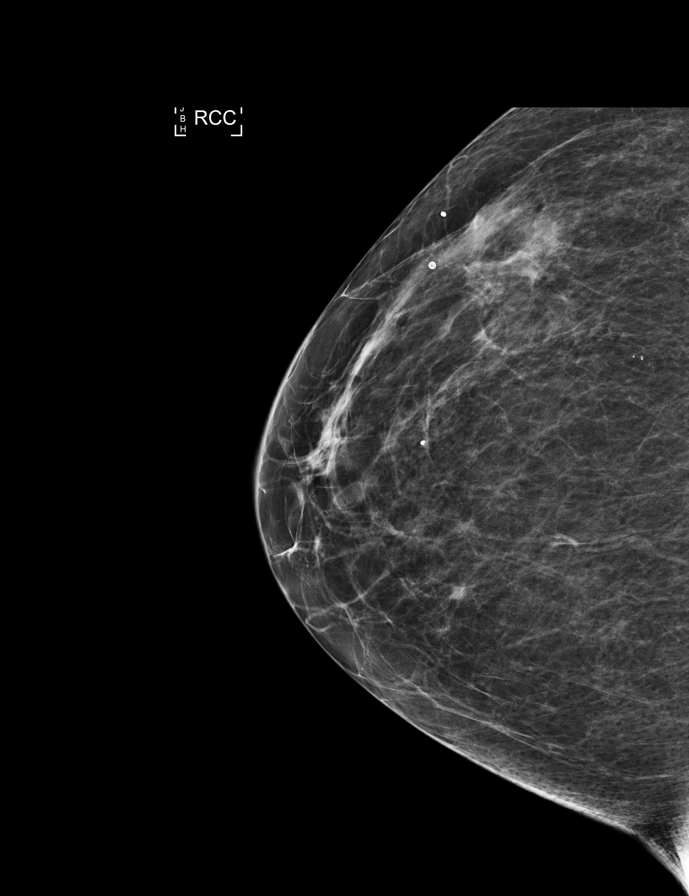

[9 of 29 positions shown; findings below may reference images not displayed]

ACR Breast Density Category b: There are scattered areas of
fibroglandular density.
FINDINGS: No suspicious mass or malignant type microcalcifications identified
in either breast.

Mammographic images were processed with CAD.
IMPRESSION: No evidence of malignancy in either breast.

RECOMMENDATION:
Bilateral screening mammogram in 1 year is recommended.

I have discussed the findings and recommendations with the patient.
Results were also provided in writing at the conclusion of the
visit. If applicable, a reminder letter will be sent to the patient
regarding the next appointment.

BI-RADS CATEGORY  1: Negative.

## 2019-03-13 ENCOUNTER — Other Ambulatory Visit: Payer: Self-pay | Admitting: Family Medicine

## 2019-03-21 ENCOUNTER — Other Ambulatory Visit: Payer: Self-pay | Admitting: Family Medicine

## 2019-03-21 DIAGNOSIS — Z1231 Encounter for screening mammogram for malignant neoplasm of breast: Secondary | ICD-10-CM

## 2019-10-08 ENCOUNTER — Ambulatory Visit: Payer: Medicare Other | Admitting: Gastroenterology

## 2019-10-17 ENCOUNTER — Ambulatory Visit: Payer: Medicare Other | Admitting: Gastroenterology

## 2019-10-17 ENCOUNTER — Other Ambulatory Visit: Payer: Self-pay

## 2019-11-01 ENCOUNTER — Encounter: Payer: Self-pay | Admitting: Family Medicine

## 2019-12-23 ENCOUNTER — Ambulatory Visit: Payer: Medicare Other | Admitting: Gastroenterology

## 2020-01-03 ENCOUNTER — Ambulatory Visit
Admission: RE | Admit: 2020-01-03 | Discharge: 2020-01-03 | Disposition: A | Discharge status: Home / Self Care | Payer: Medicare Other | Source: Ambulatory Visit | Attending: Physician Assistant | Admitting: Physician Assistant

## 2020-01-03 ENCOUNTER — Other Ambulatory Visit: Payer: Self-pay | Admitting: Physician Assistant

## 2020-01-03 ENCOUNTER — Ambulatory Visit
Admission: RE | Admit: 2020-01-03 | Discharge: 2020-01-03 | Disposition: A | Discharge status: Home / Self Care | Payer: Medicare Other | Attending: Physician Assistant | Admitting: Physician Assistant

## 2020-01-03 DIAGNOSIS — M542 Cervicalgia: Secondary | ICD-10-CM | POA: Diagnosis present

## 2020-01-15 ENCOUNTER — Ambulatory Visit: Payer: Medicare Other | Admitting: Dermatology

## 2020-04-13 DIAGNOSIS — H539 Unspecified visual disturbance: Secondary | ICD-10-CM | POA: Insufficient documentation

## 2020-04-13 DIAGNOSIS — R531 Weakness: Secondary | ICD-10-CM | POA: Insufficient documentation

## 2020-04-13 DIAGNOSIS — Z8669 Personal history of other diseases of the nervous system and sense organs: Secondary | ICD-10-CM | POA: Insufficient documentation

## 2020-04-28 ENCOUNTER — Other Ambulatory Visit: Payer: Self-pay | Admitting: Neurology

## 2020-04-28 DIAGNOSIS — R519 Headache, unspecified: Secondary | ICD-10-CM

## 2020-05-06 ENCOUNTER — Ambulatory Visit: Payer: Medicare Other

## 2020-06-03 ENCOUNTER — Inpatient Hospital Stay: Admission: RE | Admit: 2020-06-03 | Payer: Medicare Other | Source: Ambulatory Visit

## 2021-02-18 ENCOUNTER — Other Ambulatory Visit: Payer: Self-pay | Admitting: Family Medicine

## 2021-02-18 DIAGNOSIS — Z1231 Encounter for screening mammogram for malignant neoplasm of breast: Secondary | ICD-10-CM

## 2021-03-04 ENCOUNTER — Ambulatory Visit
Admission: RE | Admit: 2021-03-04 | Discharge: 2021-03-04 | Disposition: A | Discharge status: Home / Self Care | Payer: Medicare Other | Source: Ambulatory Visit | Attending: Family Medicine | Admitting: Family Medicine

## 2021-03-04 ENCOUNTER — Other Ambulatory Visit: Payer: Self-pay

## 2021-03-04 DIAGNOSIS — Z1231 Encounter for screening mammogram for malignant neoplasm of breast: Secondary | ICD-10-CM | POA: Diagnosis not present

## 2021-03-19 ENCOUNTER — Encounter: Payer: Self-pay | Admitting: Family Medicine

## 2021-03-30 ENCOUNTER — Other Ambulatory Visit: Payer: Self-pay

## 2021-03-30 ENCOUNTER — Ambulatory Visit (INDEPENDENT_AMBULATORY_CARE_PROVIDER_SITE_OTHER): Payer: Medicare Other | Admitting: Gastroenterology

## 2021-03-30 ENCOUNTER — Encounter: Payer: Self-pay | Admitting: Gastroenterology

## 2021-03-30 ENCOUNTER — Encounter: Payer: Self-pay | Admitting: Emergency Medicine

## 2021-03-30 VITALS — BP 124/86 | HR 94 | Temp 98.2°F | Ht 64.0 in | Wt 146.4 lb

## 2021-03-30 DIAGNOSIS — Z5321 Procedure and treatment not carried out due to patient leaving prior to being seen by health care provider: Secondary | ICD-10-CM | POA: Diagnosis not present

## 2021-03-30 DIAGNOSIS — R197 Diarrhea, unspecified: Secondary | ICD-10-CM | POA: Diagnosis not present

## 2021-03-30 DIAGNOSIS — R079 Chest pain, unspecified: Secondary | ICD-10-CM | POA: Insufficient documentation

## 2021-03-30 DIAGNOSIS — R1013 Epigastric pain: Secondary | ICD-10-CM | POA: Diagnosis not present

## 2021-03-30 DIAGNOSIS — R109 Unspecified abdominal pain: Secondary | ICD-10-CM | POA: Diagnosis present

## 2021-03-30 DIAGNOSIS — G8929 Other chronic pain: Secondary | ICD-10-CM

## 2021-03-30 DIAGNOSIS — R112 Nausea with vomiting, unspecified: Secondary | ICD-10-CM | POA: Insufficient documentation

## 2021-03-30 DIAGNOSIS — K529 Noninfective gastroenteritis and colitis, unspecified: Secondary | ICD-10-CM

## 2021-03-30 DIAGNOSIS — R634 Abnormal weight loss: Secondary | ICD-10-CM

## 2021-03-30 LAB — COMPREHENSIVE METABOLIC PANEL
ALT: 9 U/L (ref 0–44)
AST: 13 U/L — ABNORMAL LOW (ref 15–41)
Albumin: 3.8 g/dL (ref 3.5–5.0)
Alkaline Phosphatase: 73 U/L (ref 38–126)
Anion gap: 7 (ref 5–15)
BUN: 7 mg/dL — ABNORMAL LOW (ref 8–23)
CO2: 24 mmol/L (ref 22–32)
Calcium: 8.9 mg/dL (ref 8.9–10.3)
Chloride: 108 mmol/L (ref 98–111)
Creatinine, Ser: 1.02 mg/dL — ABNORMAL HIGH (ref 0.44–1.00)
GFR, Estimated: >60 mL/min (ref >60)
Glucose, Bld: 95 mg/dL (ref 70–99)
Potassium: 5 mmol/L (ref 3.5–5.1)
Sodium: 139 mmol/L (ref 135–145)
Total Bilirubin: 0.6 mg/dL (ref 0.3–1.2)
Total Protein: 6.6 g/dL (ref 6.5–8.1)

## 2021-03-30 LAB — URINALYSIS, ROUTINE W REFLEX MICROSCOPIC
Bilirubin Urine: NEGATIVE
Glucose, UA: NEGATIVE mg/dL
Ketones, ur: NEGATIVE mg/dL
Leukocytes,Ua: NEGATIVE
Nitrite: NEGATIVE
Protein, ur: NEGATIVE mg/dL
Specific Gravity, Urine: 1.003 — ABNORMAL LOW (ref 1.005–1.030)
pH: 5 (ref 5.0–8.0)

## 2021-03-30 LAB — CBC
HCT: 38.4 % (ref 36.0–46.0)
Hemoglobin: 12.9 g/dL (ref 12.0–15.0)
MCH: 31.8 pg (ref 26.0–34.0)
MCHC: 33.6 g/dL (ref 30.0–36.0)
MCV: 94.6 fL (ref 80.0–100.0)
Platelets: 318 10*3/uL (ref 150–400)
RBC: 4.06 MIL/uL (ref 3.87–5.11)
RDW: 13.1 % (ref 11.5–15.5)
WBC: 7.4 10*3/uL (ref 4.0–10.5)
nRBC: 0 % (ref 0.0–0.2)

## 2021-03-30 LAB — TROPONIN I (HIGH SENSITIVITY): Troponin I (High Sensitivity): 3 ng/L (ref <18)

## 2021-03-30 LAB — LIPASE, BLOOD: Lipase: 30 U/L (ref 11–51)

## 2021-03-30 NOTE — ED Triage Notes (Signed)
Pt to ED via EMS from home c/o abd pain x3 days, n/v/d, seen by GI today and dx pancreatitis. EMS gave 4mg  zofran and 126mcg fentanyl and 250cc NS.  States chest pain in triage.  A&Ox4, chest rise even and unlabored, skin WNL and in NAD at this time.

## 2021-03-30 NOTE — H&P (View-Only) (Signed)
Cephas Darby, MD [address]  [place]  [place], [place] [address]  Main: [phone]  Fax: [phone]    Gastroenterology Consultation  Referring Provider:     Remi Haggard, FNP Primary Care Physician:  Remi Haggard, FNP Primary Gastroenterologist:  Dr. Cephas Darby Reason for Consultation:     Upper abdominal pain, nausea, vomiting and diarrhea        HPI:   Glenda [name] is a 65 y.o. female referred by Dr. Remi Haggard, FNP  for consultation & management of 3 months history of upper abdominal discomfort associated with nausea, vomiting and nonbloody diarrhea.  Patient has history of Chiari malformation, gastroparesis, chronic headaches, mitral valve prolapse, cervical spondylosis without myelopathy, history of anxiety, depression, arthritis.  Patient reports that for last 1 month her symptoms of upper abdominal discomfort and bloating which are worse postprandial associated with nausea and vomiting.  She is also experiencing postprandial watery diarrhea.  Patient reports that she has the symptoms for quite some time but has gotten worse within last 1 month.  She went to [facility] ER on 03/21/2021, her vitals were normal.  Labs revealed normal lipase, CBC, CMP, troponins and urine analysis.  She is status postcholecystectomy, underwent ultrasound abdomen which revealed mildly increased echogenicity of the liver which may represent hepatic steatosis, CT abdomen and pelvis with contrast did not reveal any acute intra-abdominal pathology.  Has mild intra and extrahepatic biliary dilation of the CBD  Patient is taking promethazine with minimal relief.  She is also taking Dexilant 60 mg daily.  She was taking Linzess for constipation in the past which she has currently stopped.  She is also not taking Reglan.  Patient lost about 10 pounds within last 1 month.  She smokes heavily, half pack to 1 pack/day almost all her life.  She denies alcohol use.  Patient is accompanied  by her husband today.  NSAIDs: None  Antiplts/Anticoagulants/Anti thrombotics: None  GI Procedures: Colonoscopy more than 10 years ago and upper endoscopy in 1997  Past Medical History:  Diagnosis Date   Abdominal pain, unspecified site    occasional   Anxiety state, unspecified    Cervicalgia    Chiari malformation    Chronic airway obstruction, not elsewhere classified    Chronic headache 02/26/2015   Chronic headaches    Migraines   Chronic migraine without aura, with intractable migraine, so stated, with status migrainosus 11/13/2017   Chronic periodontitis    Depressive disorder, not elsewhere classified    Difficulty sleeping    Dizziness    Esophageal reflux    Gastroparesis    Lung infection Dec 2014   Osteoporosis    Other and unspecified hyperlipidemia    PONV (postoperative nausea and vomiting)    Pseudotumor cerebri    Swelling of ankle    bilateral   Unspecified essential hypertension    no meds currently for HBP - has been on BP meds in past    Past Surgical History:  Procedure Laterality Date   BREAST BIOPSY Left 05/2014   neg   BREAST BIOPSY Left 2005   neg   Chiari Malformation Surgery  2009   [place]   CHOLECYSTECTOMY     LAPAROSCOPIC CHOLECYSTECTOMY  06/2013   [place]   MULTIPLE EXTRACTIONS WITH ALVEOLOPLASTY N/A 11/21/2013   Procedure: Extraction of tooth #'s 2,6,7,8,9,10,11,21,22,23,24,25,26,27,28,29, 32 with alveoloplasty and bilatreal mandibular tori reductions;  Surgeon: Lenn Cal, DDS;  Location: [facility];  Service: Oral Surgery;  Laterality: N/A;   TOTAL ABDOMINAL HYSTERECTOMY  1995   [facility]    Current Outpatient Medications:    acetaminophen (TYLENOL) 500 MG tablet, Take 1,000 mg by mouth every 6 (six) hours as needed for mild pain or moderate pain., Disp: , Rfl:    clonazePAM (KLONOPIN) 1 MG tablet, Take 1 mg by mouth 2 (two) times daily as needed for anxiety. , Disp: , Rfl:    cyanocobalamin (,VITAMIN B-12,) 1000 MCG/ML injection,  Inject 1,000 mcg into the muscle every 30 (thirty) days. Patient takes on the 17th of the month., Disp: , Rfl:    dexlansoprazole (DEXILANT) 60 MG capsule, Take 60 mg by mouth daily., Disp: , Rfl:    estrogens, conjugated, (PREMARIN) 0.3 MG tablet, Take 0.3 mg by mouth daily. , Disp: , Rfl:    LINZESS 290 MCG CAPS capsule, Take 290 mcg by mouth daily., Disp: , Rfl:    PARoxetine (PAXIL) 40 MG tablet, Take 1 tablet (40 mg total) by mouth every morning., Disp: 30 tablet, Rfl: 1   potassium chloride SA (KLOR-CON) 20 MEQ tablet, Take 20 mEq by mouth 2 (two) times daily., Disp: , Rfl:    promethazine (PHENERGAN) 25 MG tablet, Take 25 mg by mouth every 6 (six) hours as needed for nausea or vomiting., Disp: , Rfl:    tiZANidine (ZANAFLEX) 4 MG tablet, Take 4 mg by mouth every 8 (eight) hours as needed for muscle spasms., Disp: , Rfl:    hydrochlorothiazide (HYDRODIURIL) 25 MG tablet, Take 25 mg by mouth daily., Disp: , Rfl:    losartan (COZAAR) 50 MG tablet, Take 50 mg by mouth daily., Disp: , Rfl:    meclizine (ANTIVERT) 25 MG tablet, Take 25 mg by mouth 3 (three) times daily as needed for dizziness., Disp: , Rfl:    metoCLOPramide (REGLAN) 10 MG tablet, Take 10 mg by mouth 4 (four) times daily., Disp: , Rfl:    oxyCODONE (ROXICODONE) 15 MG immediate release tablet, Take 15 mg by mouth as needed for pain., Disp: , Rfl:    oxyCODONE-acetaminophen (PERCOCET) 10-325 MG per tablet, Take 1 tablet by mouth every 6 (six) hours as needed for pain., Disp: , Rfl:    propranolol ER (INDERAL LA) 60 MG 24 hr capsule, Take 60 mg by mouth daily., Disp: , Rfl:   Family History  Problem Relation Age of Onset   Diabetes Mother        type II   Cancer Mother        uterine   GER disease Mother    Osteoarthritis Mother    Breast cancer Mother 27   GER disease Father    Stroke Sister    Multiple sclerosis Sister    Cancer Maternal Grandmother    Cancer Maternal Grandfather    Heart disease Paternal Grandmother     Hematuria Paternal Grandmother    Heart disease Paternal Grandfather    Hematuria Brother    Urolithiasis Brother    Heart disease Brother    Kidney cancer Maternal Uncle    Breast cancer Maternal Aunt 30     Social History   Tobacco Use   Smoking status: Every Day    Packs/day: 0.50    Years: 40.00    Pack years: 20.00    Types: Cigarettes   Smokeless tobacco: Never  Vaping Use   Vaping Use: Never used  Substance Use Topics   Alcohol use: No    Comment: Denies   Drug  use: No    Allergies as of 03/30/2021 - Review Complete 03/30/2021  Allergen Reaction Noted   Doxycycline Itching 10/03/2013   Fentanyl Other (See Comments) 10/03/2013   Gabapentin  02/26/2015   Lyrica [pregabalin]  02/26/2015   Morphine and related Other (See Comments) 10/03/2013    Review of Systems:    All systems reviewed and negative except where noted in HPI.   Physical Exam:  BP 124/86 (BP Location: Left Arm, Patient Position: Sitting, Cuff Size: Normal)   Pulse 94   Temp 98.2 F (36.8 C) (Oral)   Ht 5\' 4"  (1.626 m)   Wt 146 lb 6.4 oz (66.4 kg)   BMI 25.13 kg/m  No LMP recorded. Patient has had a hysterectomy.  General:   Alert,  Well-developed, well-nourished, pleasant and cooperative in NAD Head:  Normocephalic and atraumatic. Eyes:  Sclera clear, no icterus.   Conjunctiva pink. Ears:  Normal auditory acuity. Nose:  No deformity, discharge, or lesions. Mouth:  No deformity or lesions,oropharynx pink & moist. Neck:  Supple; no masses or thyromegaly. Lungs:  Respirations even and unlabored.  Clear throughout to auscultation.   No wheezes, crackles, or rhonchi. No acute distress. Heart:  Regular rate and rhythm; no murmurs, clicks, rubs, or gallops. Abdomen:  Normal bowel sounds. Soft, mild epigastric tenderness,distended and tympanic to percussion without masses, hepatosplenomegaly or hernias noted.  No guarding or rebound tenderness.   Rectal: Not performed Msk:  Symmetrical without  gross deformities. Good, equal movement & strength bilaterally. Pulses:  Normal pulses noted. Extremities:  No clubbing or edema.  No cyanosis. Neurologic:  Alert and oriented x3;  grossly normal neurologically. Skin:  Intact without significant lesions or rashes. No jaundice. Psych:  Alert and cooperative. Normal mood and affect.  Imaging Studies: Reviewed  Assessment and Plan:   Glenda [name] is a 65 y.o. female with history of ?  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  Gastroparesis Hold Reglan for now of After EGD, recommend gastric emptying study  Follow up in 3 to 4 months   Cephas Darby, MD

## 2021-03-30 NOTE — Progress Notes (Signed)
Cephas Darby, MD [address]  [place]  [place], [place] [address]  Main: [phone]  Fax: [phone]    Gastroenterology Consultation  Referring Provider:     Remi Haggard, FNP Primary Care Physician:  Remi Haggard, FNP Primary Gastroenterologist:  Dr. Cephas Darby Reason for Consultation:     Upper abdominal pain, nausea, vomiting and diarrhea        HPI:   Glenda [name] is a 65 y.o. female referred by Dr. Remi Haggard, FNP  for consultation & management of 3 months history of upper abdominal discomfort associated with nausea, vomiting and nonbloody diarrhea.  Patient has history of Chiari malformation, gastroparesis, chronic headaches, mitral valve prolapse, cervical spondylosis without myelopathy, history of anxiety, depression, arthritis.  Patient reports that for last 1 month her symptoms of upper abdominal discomfort and bloating which are worse postprandial associated with nausea and vomiting.  She is also experiencing postprandial watery diarrhea.  Patient reports that she has the symptoms for quite some time but has gotten worse within last 1 month.  She went to [facility] ER on 03/21/2021, her vitals were normal.  Labs revealed normal lipase, CBC, CMP, troponins and urine analysis.  She is status postcholecystectomy, underwent ultrasound abdomen which revealed mildly increased echogenicity of the liver which may represent hepatic steatosis, CT abdomen and pelvis with contrast did not reveal any acute intra-abdominal pathology.  Has mild intra and extrahepatic biliary dilation of the CBD  Patient is taking promethazine with minimal relief.  She is also taking Dexilant 60 mg daily.  She was taking Linzess for constipation in the past which she has currently stopped.  She is also not taking Reglan.  Patient lost about 10 pounds within last 1 month.  She smokes heavily, half pack to 1 pack/day almost all her life.  She denies alcohol use.  Patient is accompanied  by her husband today.  NSAIDs: None  Antiplts/Anticoagulants/Anti thrombotics: None  GI Procedures: Colonoscopy more than 10 years ago and upper endoscopy in 1997  Past Medical History:  Diagnosis Date   Abdominal pain, unspecified site    occasional   Anxiety state, unspecified    Cervicalgia    Chiari malformation    Chronic airway obstruction, not elsewhere classified    Chronic headache 02/26/2015   Chronic headaches    Migraines   Chronic migraine without aura, with intractable migraine, so stated, with status migrainosus 11/13/2017   Chronic periodontitis    Depressive disorder, not elsewhere classified    Difficulty sleeping    Dizziness    Esophageal reflux    Gastroparesis    Lung infection Dec 2014   Osteoporosis    Other and unspecified hyperlipidemia    PONV (postoperative nausea and vomiting)    Pseudotumor cerebri    Swelling of ankle    bilateral   Unspecified essential hypertension    no meds currently for HBP - has been on BP meds in past    Past Surgical History:  Procedure Laterality Date   BREAST BIOPSY Left 05/2014   neg   BREAST BIOPSY Left 2005   neg   Chiari Malformation Surgery  2009   [place]   CHOLECYSTECTOMY     LAPAROSCOPIC CHOLECYSTECTOMY  06/2013   [place]   MULTIPLE EXTRACTIONS WITH ALVEOLOPLASTY N/A 11/21/2013   Procedure: Extraction of tooth #'s 2,6,7,8,9,10,11,21,22,23,24,25,26,27,28,29, 32 with alveoloplasty and bilatreal mandibular tori reductions;  Surgeon: Lenn Cal, DDS;  Location: [facility];  Service: Oral Surgery;  Laterality: N/A;   TOTAL ABDOMINAL HYSTERECTOMY  1995   [facility]    Current Outpatient Medications:    acetaminophen (TYLENOL) 500 MG tablet, Take 1,000 mg by mouth every 6 (six) hours as needed for mild pain or moderate pain., Disp: , Rfl:    clonazePAM (KLONOPIN) 1 MG tablet, Take 1 mg by mouth 2 (two) times daily as needed for anxiety. , Disp: , Rfl:    cyanocobalamin (,VITAMIN B-12,) 1000 MCG/ML injection,  Inject 1,000 mcg into the muscle every 30 (thirty) days. Patient takes on the 17th of the month., Disp: , Rfl:    dexlansoprazole (DEXILANT) 60 MG capsule, Take 60 mg by mouth daily., Disp: , Rfl:    estrogens, conjugated, (PREMARIN) 0.3 MG tablet, Take 0.3 mg by mouth daily. , Disp: , Rfl:    LINZESS 290 MCG CAPS capsule, Take 290 mcg by mouth daily., Disp: , Rfl:    PARoxetine (PAXIL) 40 MG tablet, Take 1 tablet (40 mg total) by mouth every morning., Disp: 30 tablet, Rfl: 1   potassium chloride SA (KLOR-CON) 20 MEQ tablet, Take 20 mEq by mouth 2 (two) times daily., Disp: , Rfl:    promethazine (PHENERGAN) 25 MG tablet, Take 25 mg by mouth every 6 (six) hours as needed for nausea or vomiting., Disp: , Rfl:    tiZANidine (ZANAFLEX) 4 MG tablet, Take 4 mg by mouth every 8 (eight) hours as needed for muscle spasms., Disp: , Rfl:    hydrochlorothiazide (HYDRODIURIL) 25 MG tablet, Take 25 mg by mouth daily., Disp: , Rfl:    losartan (COZAAR) 50 MG tablet, Take 50 mg by mouth daily., Disp: , Rfl:    meclizine (ANTIVERT) 25 MG tablet, Take 25 mg by mouth 3 (three) times daily as needed for dizziness., Disp: , Rfl:    metoCLOPramide (REGLAN) 10 MG tablet, Take 10 mg by mouth 4 (four) times daily., Disp: , Rfl:    oxyCODONE (ROXICODONE) 15 MG immediate release tablet, Take 15 mg by mouth as needed for pain., Disp: , Rfl:    oxyCODONE-acetaminophen (PERCOCET) 10-325 MG per tablet, Take 1 tablet by mouth every 6 (six) hours as needed for pain., Disp: , Rfl:    propranolol ER (INDERAL LA) 60 MG 24 hr capsule, Take 60 mg by mouth daily., Disp: , Rfl:   Family History  Problem Relation Age of Onset   Diabetes Mother        type II   Cancer Mother        uterine   GER disease Mother    Osteoarthritis Mother    Breast cancer Mother 49   GER disease Father    Stroke Sister    Multiple sclerosis Sister    Cancer Maternal Grandmother    Cancer Maternal Grandfather    Heart disease Paternal Grandmother     Hematuria Paternal Grandmother    Heart disease Paternal Grandfather    Hematuria Brother    Urolithiasis Brother    Heart disease Brother    Kidney cancer Maternal Uncle    Breast cancer Maternal Aunt 30     Social History   Tobacco Use   Smoking status: Every Day    Packs/day: 0.50    Years: 40.00    Pack years: 20.00    Types: Cigarettes   Smokeless tobacco: Never  Vaping Use   Vaping Use: Never used  Substance Use Topics   Alcohol use: No    Comment: Denies   Drug  use: No    Allergies as of 03/30/2021 - Review Complete 03/30/2021  Allergen Reaction Noted   Doxycycline Itching 10/03/2013   Fentanyl Other (See Comments) 10/03/2013   Gabapentin  02/26/2015   Lyrica [pregabalin]  02/26/2015   Morphine and related Other (See Comments) 10/03/2013    Review of Systems:    All systems reviewed and negative except where noted in HPI.   Physical Exam:  BP 124/86 (BP Location: Left Arm, Patient Position: Sitting, Cuff Size: Normal)   Pulse 94   Temp 98.2 F (36.8 C) (Oral)   Ht 5\' 4"  (1.626 m)   Wt 146 lb 6.4 oz (66.4 kg)   BMI 25.13 kg/m  No LMP recorded. Patient has had a hysterectomy.  General:   Alert,  Well-developed, well-nourished, pleasant and cooperative in NAD Head:  Normocephalic and atraumatic. Eyes:  Sclera clear, no icterus.   Conjunctiva pink. Ears:  Normal auditory acuity. Nose:  No deformity, discharge, or lesions. Mouth:  No deformity or lesions,oropharynx pink & moist. Neck:  Supple; no masses or thyromegaly. Lungs:  Respirations even and unlabored.  Clear throughout to auscultation.   No wheezes, crackles, or rhonchi. No acute distress. Heart:  Regular rate and rhythm; no murmurs, clicks, rubs, or gallops. Abdomen:  Normal bowel sounds. Soft, mild epigastric tenderness,distended and tympanic to percussion without masses, hepatosplenomegaly or hernias noted.  No guarding or rebound tenderness.   Rectal: Not performed Msk:  Symmetrical without  gross deformities. Good, equal movement & strength bilaterally. Pulses:  Normal pulses noted. Extremities:  No clubbing or edema.  No cyanosis. Neurologic:  Alert and oriented x3;  grossly normal neurologically. Skin:  Intact without significant lesions or rashes. No jaundice. Psych:  Alert and cooperative. Normal mood and affect.  Imaging Studies: Reviewed  Assessment and Plan:   Glenda [name] is a 65 y.o. female with history of ?  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  Gastroparesis Hold Reglan for now of After EGD, recommend gastric emptying study  Follow up in 3 to 4 months   Cephas Darby, MD

## 2021-03-31 ENCOUNTER — Emergency Department
Admission: EM | Admit: 2021-03-31 | Discharge: 2021-03-31 | Disposition: A | Discharge status: Home / Self Care | Payer: Medicare Other | Attending: Emergency Medicine | Admitting: Emergency Medicine

## 2021-03-31 HISTORY — DX: Acute pancreatitis without necrosis or infection, unspecified: K85.90

## 2021-03-31 NOTE — ED Notes (Signed)
No answer when called several times from lobby 

## 2021-04-01 ENCOUNTER — Telehealth: Payer: Self-pay

## 2021-04-01 ENCOUNTER — Telehealth: Payer: Self-pay | Admitting: Gastroenterology

## 2021-04-01 NOTE — Telephone Encounter (Signed)
Inbound call from pt requesting a call back stating that she is in severe pain. Pt stated that she was given samples of Creon and it's not working. Please advise

## 2021-04-01 NOTE — Telephone Encounter (Signed)
CALLED PATIENT NO ANSWER LEFT VOICEMAIL FOR A CALL BACK ? ?

## 2021-04-01 NOTE — Telephone Encounter (Signed)
CALLED HUSBAND CELL AFTER CALLING THE OTHER NUMBERS AND LEFT VOICEMAIL FOR A CALL BACK

## 2021-04-02 ENCOUNTER — Telehealth: Payer: Self-pay

## 2021-04-02 NOTE — Telephone Encounter (Signed)
Sent message through my chart to please bring stools in as it may help get answers to what is going on

## 2021-04-06 ENCOUNTER — Other Ambulatory Visit: Payer: Self-pay

## 2021-04-06 ENCOUNTER — Ambulatory Visit: Payer: Medicare Other | Admitting: Gastroenterology

## 2021-04-06 ENCOUNTER — Emergency Department (HOSPITAL_COMMUNITY): Payer: Medicare Other

## 2021-04-06 ENCOUNTER — Encounter (HOSPITAL_COMMUNITY): Payer: Self-pay

## 2021-04-06 ENCOUNTER — Emergency Department (HOSPITAL_COMMUNITY)
Admission: EM | Admit: 2021-04-06 | Discharge: 2021-04-06 | Disposition: A | Discharge status: Home / Self Care | Payer: Medicare Other | Attending: Emergency Medicine | Admitting: Emergency Medicine

## 2021-04-06 DIAGNOSIS — R1084 Generalized abdominal pain: Secondary | ICD-10-CM | POA: Diagnosis not present

## 2021-04-06 DIAGNOSIS — Z79899 Other long term (current) drug therapy: Secondary | ICD-10-CM | POA: Diagnosis not present

## 2021-04-06 DIAGNOSIS — F17210 Nicotine dependence, cigarettes, uncomplicated: Secondary | ICD-10-CM | POA: Insufficient documentation

## 2021-04-06 DIAGNOSIS — I10 Essential (primary) hypertension: Secondary | ICD-10-CM | POA: Insufficient documentation

## 2021-04-06 LAB — URINALYSIS, ROUTINE W REFLEX MICROSCOPIC
Bacteria, UA: NONE SEEN
Bilirubin Urine: NEGATIVE
Glucose, UA: NEGATIVE mg/dL
Ketones, ur: NEGATIVE mg/dL
Leukocytes,Ua: NEGATIVE
Nitrite: NEGATIVE
Protein, ur: NEGATIVE mg/dL
Specific Gravity, Urine: 1.002 — ABNORMAL LOW (ref 1.005–1.030)
pH: 5 (ref 5.0–8.0)

## 2021-04-06 LAB — CBC WITH DIFFERENTIAL/PLATELET
Abs Immature Granulocytes: 0.02 10*3/uL (ref 0.00–0.07)
Basophils Absolute: 0.1 10*3/uL (ref 0.0–0.1)
Basophils Relative: 1 %
Eosinophils Absolute: 0.2 10*3/uL (ref 0.0–0.5)
Eosinophils Relative: 3 %
HCT: 37.1 % (ref 36.0–46.0)
Hemoglobin: 12.1 g/dL (ref 12.0–15.0)
Immature Granulocytes: 0 %
Lymphocytes Relative: 25 %
Lymphs Abs: 1.9 10*3/uL (ref 0.7–4.0)
MCH: 30.9 pg (ref 26.0–34.0)
MCHC: 32.6 g/dL (ref 30.0–36.0)
MCV: 94.6 fL (ref 80.0–100.0)
Monocytes Absolute: 0.5 10*3/uL (ref 0.1–1.0)
Monocytes Relative: 7 %
Neutro Abs: 4.8 10*3/uL (ref 1.7–7.7)
Neutrophils Relative %: 64 %
Platelets: 317 10*3/uL (ref 150–400)
RBC: 3.92 MIL/uL (ref 3.87–5.11)
RDW: 13 % (ref 11.5–15.5)
WBC: 7.6 10*3/uL (ref 4.0–10.5)
nRBC: 0 % (ref 0.0–0.2)

## 2021-04-06 LAB — COMPREHENSIVE METABOLIC PANEL
ALT: 8 U/L (ref 0–44)
AST: 11 U/L — ABNORMAL LOW (ref 15–41)
Albumin: 3.4 g/dL — ABNORMAL LOW (ref 3.5–5.0)
Alkaline Phosphatase: 61 U/L (ref 38–126)
Anion gap: 8 (ref 5–15)
BUN: 5 mg/dL — ABNORMAL LOW (ref 8–23)
CO2: 22 mmol/L (ref 22–32)
Calcium: 8.7 mg/dL — ABNORMAL LOW (ref 8.9–10.3)
Chloride: 100 mmol/L (ref 98–111)
Creatinine, Ser: 0.97 mg/dL (ref 0.44–1.00)
GFR, Estimated: >60 mL/min (ref >60)
Glucose, Bld: 92 mg/dL (ref 70–99)
Potassium: 4.3 mmol/L (ref 3.5–5.1)
Sodium: 130 mmol/L — ABNORMAL LOW (ref 135–145)
Total Bilirubin: 0.4 mg/dL (ref 0.3–1.2)
Total Protein: 5.7 g/dL — ABNORMAL LOW (ref 6.5–8.1)

## 2021-04-06 LAB — LIPASE, BLOOD: Lipase: 21 U/L (ref 11–51)

## 2021-04-06 MED ORDER — ONDANSETRON 4 MG PO TBDP
4.0000 mg | ORAL_TABLET | Freq: Once | ORAL | Status: AC
  Administered 2021-04-06: 4 mg via ORAL
  Filled 2021-04-06: qty 1

## 2021-04-06 MED ORDER — MORPHINE SULFATE (PF) 4 MG/ML IV SOLN
4.0000 mg | Freq: Once | INTRAVENOUS | Status: AC
  Administered 2021-04-06: 4 mg via INTRAVENOUS
  Filled 2021-04-06: qty 1

## 2021-04-06 MED ORDER — IOHEXOL 300 MG/ML  SOLN
100.0000 mL | Freq: Once | INTRAMUSCULAR | Status: AC | PRN
  Administered 2021-04-06: 100 mL via INTRAVENOUS

## 2021-04-06 MED ORDER — ONDANSETRON HCL 4 MG/2ML IJ SOLN
4.0000 mg | Freq: Once | INTRAMUSCULAR | Status: AC
  Administered 2021-04-06: 4 mg via INTRAVENOUS
  Filled 2021-04-06: qty 2

## 2021-04-06 MED ORDER — SODIUM CHLORIDE 0.9 % IV BOLUS
1000.0000 mL | Freq: Once | INTRAVENOUS | Status: AC
  Administered 2021-04-06: 1000 mL via INTRAVENOUS

## 2021-04-06 MED ORDER — DICYCLOMINE HCL 20 MG PO TABS: 20.0000 mg | ORAL_TABLET | Freq: Two times a day (BID) | ORAL | 0 refills | Status: DC

## 2021-04-06 NOTE — ED Provider Notes (Signed)
Signout received for this 65 year old female who presents with persistent abdominal pain that became worse after she started a new medication from her GI specialist.  She is awaiting CT scan and reevaluation. Physical Exam  BP 138/75   Pulse 80   Temp 99.1 F (37.3 C) (Oral)   Resp 14   Ht 5\' 4"  (1.626 m)   Wt 63.5 kg   SpO2 99%   BMI 24.03 kg/m    ED Course/Procedures     Procedures  MDM  Abdominal exam is benign on reevaluation.  Patient reports improvement in her pain following pain medication.  CT scan without acute findings, however does demonstrate stool throughout the colon.  Discussed bowel regimen with MiraLAX and Colace.  Also shows left complex but benign renal cyst.  Patient does have a known history of this.  Discussed follow-up with PCP for ongoing monitoring.  Discussed importance of follow-up with GI specialist regarding her abdominal pain.  She does have upcoming endoscopy colonoscopy next month.  Daughter and patient voiced understanding and are in agreement with plan.       Evlyn Courier, PA-C 04/06/21 1800    Regan Lemming, MD 04/06/21 2242

## 2021-04-06 NOTE — ED Triage Notes (Signed)
Pt presents c/o chronic mid abdominal pain with N/V/D. Pt states she has hx of gastroparesis. States she doesn't feel like she is unable to empty her bladder when urinating.

## 2021-04-06 NOTE — Discharge Instructions (Signed)
Your blood work in the emergency room was reassuring.  Your CT scan was without any acute infection or findings, however it did show constipation.  I recommend you take over-the-counter MiraLAX and Colace.  This could potentially be contributing to your abdominal pain.  I have also sent in Bentyl for you to use as needed for abdominal pain.  I recommend you call your GI specialist to let them know you had to come to the emergency room for abdominal pain.  If you develop fever, worsening of your abdominal pain, nausea vomiting to the point you are unable to keep any food or drink down please return to the emergency room

## 2021-04-06 NOTE — ED Provider Notes (Signed)
Emergency Medicine Provider Triage Evaluation Note  Glenda [name] , a 65 y.o. female  was evaluated in triage.  Pt complains of abd pain, nv. Told she had pancreatitis by gi. Started on creon but feels worse.  Review of Systems  Positive: Abd pain, nv Negative: fever  Physical Exam  BP (!) 112/94 (BP Location: Left Arm)   Pulse (!) 116   Temp 99.1 F (37.3 C) (Oral)   Resp 16   SpO2 99%  Gen:   Awake, no distress   Resp:  Normal effort  MSK:   Moves extremities without difficulty  Medical Decision Making  Medically screening exam initiated at 11:57 AM.  Appropriate orders placed.  YZABELLE CALLES was informed that the remainder of the evaluation will be completed by another provider, this initial triage assessment does not replace that evaluation, and the importance of remaining in the ED until their evaluation is complete.     Bishop Dublin 04/06/21 1158    Godfrey Pick, MD 04/06/21 1945

## 2021-04-06 NOTE — ED Provider Notes (Signed)
[facility] Provider Note   CSN: [id number] Arrival date & time: 04/06/21  1145     History Chief Complaint  Patient presents with   Abdominal Pain    Glenda [name] is a 65 y.o. female with PMHx gastroparesis who presents to the ED today with complaint of worsening diffuse abdominal pain for the past 1 week.  She reports history of gastroparesis in the past.  She states that she recently saw GI on 11/15 and was started on Creon as they thought her symptoms were likely due to pancreatitis.  She states that since taking the Creon she has had worsening pain.  She also complains of difficulty urinating which she states is new.  She states that she has had multiple episodes of emesis however feels like she is staying as hydrated as she can and does not think this is attributing to her urinary complaints.  She denies any dysuria.  She states now the pain is radiating into her right flank which is also new.  She denies history of kidney stones in the past.  Denies any fevers or chills.  Past surgical history includes cholecystectomy and hysterectomy.   GI Office Visit 11/15: Glenda [name] is a 65 y.o. female with history of ?  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  Gastroparesis Hold Reglan for now of After EGD, recommend gastric emptying study  The  history is provided by the patient, a relative and medical records.      Past Medical History:  Diagnosis Date   Abdominal pain, unspecified site    occasional   Anxiety state, unspecified    Cervicalgia    Chiari malformation    Chronic airway obstruction, not elsewhere classified    Chronic headache 02/26/2015   Chronic headaches    Migraines   Chronic migraine without aura, with intractable migraine, [name] stated, with status migrainosus 11/13/2017   Chronic periodontitis    Depressive disorder, not elsewhere classified    Difficulty sleeping    Dizziness    Esophageal reflux    Gastroparesis    Lung infection 04/2013   Osteoporosis    Other and unspecified hyperlipidemia    Pancreatitis    PONV (postoperative nausea and vomiting)    Pseudotumor cerebri    Swelling of ankle    bilateral   Unspecified essential hypertension    no meds currently for HBP - has been on BP meds in past    Patient Active Problem List   Diagnosis Date Noted   Hx of papilledema 04/13/2020   Visual changes 04/13/2020   Weakness 04/13/2020   Chronic migraine without aura, with intractable migraine, [name] stated, with status migrainosus 11/13/2017   Chronic headache 02/26/2015   Cervical spondylosis without myelopathy 02/26/2015   G E R D 06/21/2008   DEPRESSION 06/06/2008   LUNG NODULE 06/06/2008    Past Surgical History:  Procedure Laterality Date   BREAST BIOPSY Left 05/2014   neg   BREAST BIOPSY Left 2005  neg   Chiari Malformation Surgery  2009   [place]   CHOLECYSTECTOMY     LAPAROSCOPIC CHOLECYSTECTOMY  06/2013   [place]   MULTIPLE EXTRACTIONS WITH ALVEOLOPLASTY N/A 11/21/2013   Procedure: Extraction of tooth #'s 2,6,7,8,9,10,11,21,22,23,24,25,26,27,28,29, 32 with alveoloplasty and bilatreal mandibular tori reductions;  Surgeon: Glenda [name], DDS;  Location: [facility];  Service: Oral Surgery;  Laterality: N/A;   TOTAL ABDOMINAL HYSTERECTOMY  1995   [place]     OB History   No  obstetric history on file.     Family History  Problem Relation Age of Onset   Diabetes Mother        type II   Cancer Mother        uterine   GER disease Mother    Osteoarthritis Mother    Breast cancer Mother 44   GER disease Father    Stroke Sister    Multiple sclerosis Sister    Cancer Maternal Grandmother    Cancer Maternal Grandfather    Heart disease Paternal Grandmother    Hematuria Paternal Grandmother    Heart disease Paternal Grandfather    Hematuria Brother    Urolithiasis Brother    Heart disease Brother    Kidney cancer Maternal Uncle    Breast cancer Maternal Aunt 30    Social History   Tobacco Use   Smoking status: Every Day    Packs/day: 0.50    Years: 40.00    Pack years: 20.00    Types: Cigarettes   Smokeless tobacco: Never  Vaping Use   Vaping Use: Never used  Substance Use Topics   Alcohol use: No    Comment: Denies   Drug use: No    Home Medications Prior to Admission medications   Medication Sig Start Date End Date Taking? Authorizing Provider  acetaminophen (TYLENOL) 500 MG tablet Take 1,000 mg by mouth every 6 (six) hours as needed for mild pain or moderate pain.    [provider]  clonazePAM (KLONOPIN) 1 MG tablet Take 1 mg by mouth 2 (two) times daily as needed for anxiety.     [provider]  cyanocobalamin (,VITAMIN B-12,) 1000 MCG/ML injection Inject 1,000 mcg into the muscle every 30 (thirty) days. Patient takes on the 17th of the month.    [provider]  dexlansoprazole (DEXILANT) 60 MG capsule Take 60 mg by mouth daily.    [provider]  estrogens, conjugated, (PREMARIN) 0.3 MG tablet Take 0.3 mg by mouth daily.     [provider]  LINZESS 290 MCG CAPS capsule Take 290 mcg by mouth daily. 12/13/15   [provider]  oxyCODONE (OXYCONTIN) 20 mg 12 hr tablet Take by mouth.    [provider]  Oxycodone HCl 10 MG TABS Take 10 mg by mouth 4 (four) times daily as  needed. 02/03/21   [provider]  PARoxetine (PAXIL) 40 MG tablet Take 1 tablet (40 mg total) by mouth every morning. 03/14/16   Glenda So, MD  potassium chloride SA (KLOR-CON) 20 MEQ tablet Take 20 mEq by mouth 2 (two) times daily. 09/11/19   [provider]  promethazine (PHENERGAN) 25 MG tablet Take 25 mg by mouth every 6 (six) hours as needed for nausea or vomiting.    [provider]  tiZANidine (ZANAFLEX) 4 MG tablet Take 4 mg by mouth every 8 (eight) hours as needed for muscle spasms.    [provider]    Allergies  Doxycycline, Fentanyl, Gabapentin, Lyrica [pregabalin], and Morphine and related  Review of Systems   Review of Systems  Constitutional:  Negative for chills and fever.  Respiratory:  Negative for shortness of breath.   Cardiovascular:  Negative for chest pain.  Gastrointestinal:  Positive for abdominal pain, diarrhea, nausea and vomiting.  Genitourinary:  Positive for difficulty urinating. Negative for dysuria.  All other systems reviewed and are negative.  Physical Exam Updated Vital Signs BP (!) 112/94 (BP Location: Left Arm)   Pulse (!) 116   Temp 99.1 F (37.3 C) (Oral)   Resp 16   Ht 5\' 4"  (1.626 m)   Wt 63.5 kg   SpO2 99%   BMI 24.03 kg/m   Physical Exam Vitals and nursing note reviewed.  Constitutional:      Appearance: She is not ill-appearing or diaphoretic.  HENT:     Head: Normocephalic and atraumatic.     Mouth/Throat:     Mouth: Mucous membranes are dry.  Eyes:     Conjunctiva/sclera: Conjunctivae normal.  Cardiovascular:     Rate and Rhythm: Normal rate and regular rhythm.  Pulmonary:     Effort: Pulmonary effort is normal.     Breath sounds: Normal breath sounds. No wheezing, rhonchi or rales.  Abdominal:     General: Abdomen is flat. Bowel sounds are normal.     Palpations: Abdomen is soft.     Tenderness: There is generalized abdominal tenderness. There is no right CVA tenderness, left  CVA tenderness, guarding or rebound.  Musculoskeletal:     Cervical back: Neck supple.  Skin:    General: Skin is warm and dry.  Neurological:     Mental Status: She is alert.    ED Results / Procedures / Treatments   Labs (all labs ordered are listed, but only abnormal results are displayed) Labs Reviewed  COMPREHENSIVE METABOLIC PANEL - Abnormal; Notable for the following components:      Result Value   Sodium 130 (*)    BUN 5 (*)    Calcium 8.7 (*)    Total Protein 5.7 (*)    Albumin 3.4 (*)    AST 11 (*)    All other components within normal limits  CBC WITH DIFFERENTIAL/PLATELET  LIPASE, BLOOD  URINALYSIS, ROUTINE W REFLEX MICROSCOPIC    EKG None  Radiology No results found.  Procedures Procedures   Medications Ordered in ED Medications  ondansetron (ZOFRAN-ODT) disintegrating tablet 4 mg (4 mg Oral Given 04/06/21 1207)    ED Course  I have reviewed the triage vital signs and the nursing notes.  Pertinent labs & imaging results that were available during my care of the patient were reviewed by me and considered in my medical decision making (see chart for details).    MDM Rules/Calculators/A&P                           65 year old female who presents to the ED today with worsening diffuse abdominal pain for the past week secondary to being placed on Creon last week.  Has had issues with chronic abdominal pain/history of gastroparesis for the past 3 to 4 months with associated nausea, vomiting, diarrhea.  Pain more severe now prompting ED visit.  On arrival to the ED temperature is 99.1.  She is tachycardic in the 110's.  Meinders of vitals unremarkable.  She was medically screened in the waiting room and work-up started including CBC, CMP, lipase, UA.  She does mention feeling like she is having difficulty urinating however denies any dysuria.  Urinalysis with small hemoglobin on dipstick however 0-5 red blood cells seen.  No signs of infection.  No CVA  tenderness palpation on exam however patient does state that the pain mainly radiates to her right flank area.  Past surgical history of cholecystectomy.  CBC does not show an elevated white blood cell count.  Hemoglobin stable at 12.1.  CMP with a sodium of 130.  No other electrolyte abnormalities.  Lipase within normal limits at 21.  Given worsening pain we will plan for CT abdomen and pelvis for further evaluation.  Will provide fluids, antiemetics, pain medication and reevaluate.  Patient does have plans for EGD and colonoscopy in approximately 1 month.  At shift change case signed out to [employer] PA-C who will follow up on CT scan.   This note was prepared using Dragon voice recognition software and may include unintentional dictation errors due to the inherent limitations of voice recognition software.  Final Clinical Impression(s) / ED Diagnoses Final diagnoses:  None    Rx / DC Orders ED Discharge Orders     None        Eustaquio Maize, PA-C 04/06/21 1517    Charlesetta Shanks, MD 04/07/21 [phone]

## 2021-04-07 LAB — GI PROFILE, STOOL, PCR

## 2021-04-07 LAB — CALPROTECTIN, FECAL: Calprotectin, Fecal: 55 ug/g (ref 0–120)

## 2021-04-07 LAB — PANCREATIC ELASTASE, FECAL: Pancreatic Elastase, Fecal: 412 ug Elast./g (ref >200)

## 2021-04-14 ENCOUNTER — Telehealth: Payer: Self-pay

## 2021-04-14 ENCOUNTER — Telehealth: Payer: Self-pay | Admitting: Gastroenterology

## 2021-04-14 NOTE — Telephone Encounter (Signed)
Called patient about her results she understands and see [place] at her procedure day

## 2021-04-14 NOTE — Telephone Encounter (Signed)
CALLED PATIENT NO ANSWER LEFT VOICEMAIL FOR A CALL BACK ? ?

## 2021-04-14 NOTE — Telephone Encounter (Signed)
Inbound call from pt returning your phone call. 

## 2021-04-19 ENCOUNTER — Telehealth: Payer: Self-pay | Admitting: Gastroenterology

## 2021-04-19 NOTE — Telephone Encounter (Signed)
Inbound call from pt's daughter April requesting a call back stated that she has urgent concerns regarding her mother. Her best contact is  [phone]. Please advise. Thank you.

## 2021-04-21 ENCOUNTER — Telehealth: Payer: Self-pay

## 2021-04-21 ENCOUNTER — Encounter: Payer: Self-pay | Admitting: Gastroenterology

## 2021-04-21 NOTE — Telephone Encounter (Signed)
CALLED PATIENTS DAUGHTER BACK AS REQUESTED NO ANSWER LEFT VOICEMAIL FOR HER

## 2021-04-23 ENCOUNTER — Encounter: Payer: Self-pay | Admitting: Gastroenterology

## 2021-04-23 ENCOUNTER — Encounter
Admission: RE | Disposition: A | Discharge status: Home / Self Care | Payer: Self-pay | Source: Home / Self Care | Attending: Gastroenterology

## 2021-04-23 ENCOUNTER — Other Ambulatory Visit: Payer: Self-pay

## 2021-04-23 ENCOUNTER — Ambulatory Visit: Payer: Medicare Other | Admitting: Anesthesiology

## 2021-04-23 ENCOUNTER — Ambulatory Visit
Admission: RE | Admit: 2021-04-23 | Discharge: 2021-04-23 | Disposition: A | Discharge status: Home / Self Care | Payer: Medicare Other | Attending: Gastroenterology | Admitting: Gastroenterology

## 2021-04-23 DIAGNOSIS — J449 Chronic obstructive pulmonary disease, unspecified: Secondary | ICD-10-CM | POA: Diagnosis not present

## 2021-04-23 DIAGNOSIS — G43909 Migraine, unspecified, not intractable, without status migrainosus: Secondary | ICD-10-CM | POA: Diagnosis not present

## 2021-04-23 DIAGNOSIS — G932 Benign intracranial hypertension: Secondary | ICD-10-CM | POA: Insufficient documentation

## 2021-04-23 DIAGNOSIS — M47812 Spondylosis without myelopathy or radiculopathy, cervical region: Secondary | ICD-10-CM | POA: Insufficient documentation

## 2021-04-23 DIAGNOSIS — R112 Nausea with vomiting, unspecified: Secondary | ICD-10-CM

## 2021-04-23 DIAGNOSIS — K219 Gastro-esophageal reflux disease without esophagitis: Secondary | ICD-10-CM | POA: Diagnosis not present

## 2021-04-23 DIAGNOSIS — K5730 Diverticulosis of large intestine without perforation or abscess without bleeding: Secondary | ICD-10-CM | POA: Diagnosis not present

## 2021-04-23 DIAGNOSIS — F32A Depression, unspecified: Secondary | ICD-10-CM | POA: Insufficient documentation

## 2021-04-23 DIAGNOSIS — K31A11 Gastric intestinal metaplasia without dysplasia, involving the antrum: Secondary | ICD-10-CM | POA: Insufficient documentation

## 2021-04-23 DIAGNOSIS — I10 Essential (primary) hypertension: Secondary | ICD-10-CM | POA: Diagnosis not present

## 2021-04-23 DIAGNOSIS — F17210 Nicotine dependence, cigarettes, uncomplicated: Secondary | ICD-10-CM | POA: Insufficient documentation

## 2021-04-23 DIAGNOSIS — K529 Noninfective gastroenteritis and colitis, unspecified: Secondary | ICD-10-CM | POA: Insufficient documentation

## 2021-04-23 DIAGNOSIS — F419 Anxiety disorder, unspecified: Secondary | ICD-10-CM | POA: Insufficient documentation

## 2021-04-23 DIAGNOSIS — M1990 Unspecified osteoarthritis, unspecified site: Secondary | ICD-10-CM | POA: Diagnosis not present

## 2021-04-23 DIAGNOSIS — K319 Disease of stomach and duodenum, unspecified: Secondary | ICD-10-CM | POA: Diagnosis not present

## 2021-04-23 DIAGNOSIS — K314 Gastric diverticulum: Secondary | ICD-10-CM | POA: Insufficient documentation

## 2021-04-23 DIAGNOSIS — K2970 Gastritis, unspecified, without bleeding: Secondary | ICD-10-CM | POA: Insufficient documentation

## 2021-04-23 DIAGNOSIS — I341 Nonrheumatic mitral (valve) prolapse: Secondary | ICD-10-CM | POA: Insufficient documentation

## 2021-04-23 DIAGNOSIS — R1084 Generalized abdominal pain: Secondary | ICD-10-CM | POA: Diagnosis present

## 2021-04-23 HISTORY — DX: Presence of dental prosthetic device (complete) (partial): Z97.2

## 2021-04-23 HISTORY — DX: Dizziness and giddiness: R42

## 2021-04-23 HISTORY — PX: ESOPHAGOGASTRODUODENOSCOPY: SHX5428

## 2021-04-23 HISTORY — PX: COLONOSCOPY WITH PROPOFOL: SHX5780

## 2021-04-23 HISTORY — DX: Family history of other specified conditions: Z84.89

## 2021-04-23 SURGERY — COLONOSCOPY WITH PROPOFOL
Anesthesia: General

## 2021-04-23 MED ORDER — GLYCOPYRROLATE 0.2 MG/ML IJ SOLN
INTRAMUSCULAR | Status: DC | PRN
  Administered 2021-04-23: .1 mg via INTRAVENOUS

## 2021-04-23 MED ORDER — PROPOFOL 10 MG/ML IV BOLUS
INTRAVENOUS | Status: DC | PRN
  Administered 2021-04-23 (×2): 20 mg via INTRAVENOUS
  Administered 2021-04-23: 100 mg via INTRAVENOUS
  Administered 2021-04-23: 40 mg via INTRAVENOUS
  Administered 2021-04-23 (×2): 20 mg via INTRAVENOUS
  Administered 2021-04-23: 40 mg via INTRAVENOUS

## 2021-04-23 MED ORDER — ACETAMINOPHEN 325 MG PO TABS: 325.0000 mg | ORAL_TABLET | Freq: Once | ORAL | Status: DC

## 2021-04-23 MED ORDER — ACETAMINOPHEN 160 MG/5ML PO SOLN: 325.0000 mg | Freq: Once | ORAL | Status: DC

## 2021-04-23 MED ORDER — SODIUM CHLORIDE 0.9 % IV SOLN: INTRAVENOUS | Status: DC

## 2021-04-23 MED ORDER — LACTATED RINGERS IV SOLN: INTRAVENOUS | Status: DC

## 2021-04-23 MED ORDER — LIDOCAINE HCL (CARDIAC) PF 100 MG/5ML IV SOSY
PREFILLED_SYRINGE | INTRAVENOUS | Status: DC | PRN
  Administered 2021-04-23: 30 mg via INTRAVENOUS

## 2021-04-23 SURGICAL SUPPLY — 22 items
CLIP HMST 235XBRD CATH ROT (MISCELLANEOUS) IMPLANT
CLIP RESOLUTION 360 11X235 (MISCELLANEOUS)
ELECT REM PT RETURN 9FT ADLT (ELECTROSURGICAL)
ELECTRODE REM PT RTRN 9FT ADLT (ELECTROSURGICAL) IMPLANT
FORCEPS BIOP RAD 4 LRG CAP 4 (CUTTING FORCEPS) ×2 IMPLANT
GOWN CVR UNV OPN BCK APRN NK (MISCELLANEOUS) ×2 IMPLANT
GOWN ISOL THUMB LOOP REG UNIV (MISCELLANEOUS) ×4
INJECTOR VARIJECT VIN23 (MISCELLANEOUS) IMPLANT
KIT DEFENDO VALVE AND CONN (KITS) IMPLANT
KIT PRC NS LF DISP ENDO (KITS) ×1 IMPLANT
KIT PROCEDURE OLYMPUS (KITS) ×2
MANIFOLD NEPTUNE II (INSTRUMENTS) ×2 IMPLANT
MARKER SPOT ENDO TATTOO 5ML (MISCELLANEOUS) IMPLANT
PROBE APC STR FIRE (PROBE) IMPLANT
RETRIEVER NET ROTH 2.5X230 LF (MISCELLANEOUS) IMPLANT
SNARE COLD EXACTO (MISCELLANEOUS) IMPLANT
SNARE SHORT THROW 13M SML OVAL (MISCELLANEOUS) IMPLANT
SNARE SNG USE RND 15MM (INSTRUMENTS) IMPLANT
SPOT EX ENDOSCOPIC TATTOO (MISCELLANEOUS)
TRAP ETRAP POLY (MISCELLANEOUS) IMPLANT
VARIJECT INJECTOR VIN23 (MISCELLANEOUS)
WATER STERILE IRR 250ML POUR (IV SOLUTION) ×2 IMPLANT

## 2021-04-23 NOTE — Op Note (Signed)
[facility] Gastroenterology Patient Name: Glenda [name] Procedure Date: 04/23/2021 8:52 AM MRN: [id number] Account #: [id number] Date of Birth: 07/02/1955 Admit Type: Outpatient Age: 65 Room: [facility] OR ROOM 01 Gender: Female Note Status: Finalized Instrument Name: [id number] Procedure:             Colonoscopy Indications:           Generalized abdominal pain, Chronic diarrhea Providers:             [name] MD, MD Referring MD:          Jordan Likes. Lavena Bullion (Referring MD) Medicines:             Propofol per Anesthesia Complications:         No immediate complications. Procedure:             Pre-Anesthesia Assessment:                        - Prior to the procedure, a History and Physical was                         performed, and patient medications and allergies were                         reviewed. The patient's tolerance of previous                         anesthesia was also reviewed. The risks and benefits                         of the procedure and the sedation options and risks                         were discussed with the patient. All questions were                         answered, and informed consent was obtained. Prior                         Anticoagulants: The patient has taken no previous                         anticoagulant or antiplatelet agents. ASA Grade                         Assessment: II - A patient with mild systemic disease.                         After reviewing the risks and benefits, the patient                         was deemed in satisfactory condition to undergo the                         procedure.                        After obtaining informed consent, the colonoscope was  passed under direct vision. Throughout the procedure,                         the patient's blood pressure, pulse, and oxygen                         saturations were monitored continuously. The                         Colonoscope  was introduced through the anus and                         advanced to the the terminal ileum. The colonoscopy                         was performed without difficulty. The patient                         tolerated the procedure well. The quality of the bowel                         preparation was excellent. Findings:      The perianal and digital rectal examinations were normal.      The terminal ileum appeared normal. Biopsies were taken with a cold       forceps for histology.      Multiple small-mouthed diverticula were found in the sigmoid colon.      Random biopsies were obtained with cold forceps for histology randomly       in the entire colon. Impression:            - The examined portion of the ileum was normal.                         Biopsied.                        - Diverticulosis in the sigmoid colon.                        - Random biopsies were obtained in the entire colon. Recommendation:        - Discharge patient to home.                        - Resume previous diet.                        - Continue present medications.                        - Await pathology results. Procedure Code(s):     --- Professional ---                        [phone], Colonoscopy, flexible; with biopsy, single or                         multiple Diagnosis Code(s):     --- Professional ---                        R10.84, Generalized abdominal pain  K52.9, Noninfective gastroenteritis and colitis,                         unspecified CPT copyright 2019 American Medical Association. All rights reserved. The codes documented in this report are preliminary and upon coder review may  be revised to meet current compliance requirements. [name] MD, MD 04/23/2021 9:31:18 AM This report has been signed electronically. Number of Addenda: 0 Note Initiated On: 04/23/2021 8:52 AM Scope Withdrawal Time: 0 hours 6 minutes 56 seconds  Total Procedure Duration: 0 hours 9 minutes  39 seconds  Estimated Blood Loss:  Estimated blood loss: none.      [facility]

## 2021-04-23 NOTE — Anesthesia Procedure Notes (Signed)
Date/Time: 04/23/2021 9:07 AM Performed by: Cameron Ali, CRNA Pre-anesthesia Checklist: Patient identified, Emergency Drugs available, Suction available, Timeout performed and Patient being monitored Patient Re-evaluated:Patient Re-evaluated prior to induction Oxygen Delivery Method: Nasal cannula Placement Confirmation: positive ETCO2

## 2021-04-23 NOTE — Anesthesia Postprocedure Evaluation (Signed)
Anesthesia Post Note  Patient: Glenda [name]  Procedure(s) Performed: COLONOSCOPY WITH PROPOFOL ESOPHAGOGASTRODUODENOSCOPY (EGD)     Patient location during evaluation: PACU Anesthesia Type: General Level of consciousness: awake and alert and oriented Pain management: satisfactory to patient Vital Signs Assessment: post-procedure vital signs reviewed and stable Respiratory status: spontaneous breathing, nonlabored ventilation and respiratory function stable Cardiovascular status: blood pressure returned to baseline and stable Postop Assessment: Adequate PO intake and No signs of nausea or vomiting Anesthetic complications: no   No notable events documented.  Raliegh Ip

## 2021-04-23 NOTE — Anesthesia Preprocedure Evaluation (Signed)
Anesthesia Evaluation  Patient identified by MRN, date of birth, ID band Patient awake    Reviewed: Allergy & Precautions, H&P , NPO status , Patient's Chart, lab work & pertinent test results  Airway Mallampati: II  TM Distance: >3 FB Neck ROM: full    Dental  (+) Upper Dentures, Lower Dentures   Pulmonary COPD, Current SmokerPatient did not abstain from smoking.,    Pulmonary exam normal breath sounds clear to auscultation       Cardiovascular hypertension, Normal cardiovascular exam Rhythm:regular Rate:Normal     Neuro/Psych  Headaches, PSYCHIATRIC DISORDERS Pseudotumor Cerebri    GI/Hepatic GERD  ,  Endo/Other    Renal/GU      Musculoskeletal   Abdominal   Peds  Hematology   Anesthesia Other Findings   Reproductive/Obstetrics                             Anesthesia Physical Anesthesia Plan  ASA: 3  Anesthesia Plan: General   Post-op Pain Management: Minimal or no pain anticipated   Induction: Intravenous  PONV Risk Score and Plan: 3 and Treatment may vary due to age or medical condition, Propofol infusion and TIVA  Airway Management Planned: Natural Airway  Additional Equipment:   Intra-op Plan:   Post-operative Plan:   Informed Consent: I have reviewed the patients History and Physical, chart, labs and discussed the procedure including the risks, benefits and alternatives for the proposed anesthesia with the patient or authorized representative who has indicated his/her understanding and acceptance.     Dental Advisory Given  Plan Discussed with: CRNA  Anesthesia Plan Comments:         Anesthesia Quick Evaluation

## 2021-04-23 NOTE — Op Note (Signed)
[facility] Gastroenterology Patient Name: Glenda [name] Procedure Date: 04/23/2021 8:53 AM MRN: [id number] Account #: [id number] Date of Birth: 1956/02/17 Admit Type: Outpatient Age: 64 Room: [facility] OR ROOM 01 Gender: Female Note Status: Finalized Instrument Name: [id number] Procedure:             Upper GI endoscopy Indications:           Generalized abdominal pain, Nausea with vomiting Providers:             [name] MD, MD Referring MD:          Jordan Likes. Lavena Bullion (Referring MD) Medicines:             Propofol per Anesthesia Complications:         No immediate complications. Procedure:             Pre-Anesthesia Assessment:                        - Prior to the procedure, a History and Physical was                         performed, and patient medications and allergies were                         reviewed. The patient's tolerance of previous                         anesthesia was also reviewed. The risks and benefits                         of the procedure and the sedation options and risks                         were discussed with the patient. All questions were                         answered, and informed consent was obtained. Prior                         Anticoagulants: The patient has taken no previous                         anticoagulant or antiplatelet agents. ASA Grade                         Assessment: II - A patient with mild systemic disease.                         After reviewing the risks and benefits, the patient                         was deemed in satisfactory condition to undergo the                         procedure.                        After obtaining informed consent, the endoscope was  passed under direct vision. Throughout the procedure,                         the patient's blood pressure, pulse, and oxygen                         saturations were monitored continuously. The Endoscope                          was introduced through the mouth, and advanced to the                         second part of duodenum. The upper GI endoscopy was                         accomplished without difficulty. The patient tolerated                         the procedure well. Findings:      The examined esophagus was normal.      Localized mild inflammation characterized by erythema was found in the       gastric antrum. Biopsies were taken with a cold forceps for histology.      The examined duodenum was normal.      A non-bleeding diverticulum was found in the gastric fundus. Impression:            - Normal esophagus.                        - Gastritis. Biopsied.                        - Normal examined duodenum.                        - Gastric diverticulum. Recommendation:        - Discharge patient to home.                        - Resume previous diet.                        - Continue present medications.                        - Await pathology results.                        - Perform a colonoscopy today. Procedure Code(s):     --- Professional ---                        [phone], Esophagogastroduodenoscopy, flexible,                         transoral; with biopsy, single or multiple Diagnosis Code(s):     --- Professional ---                        R11.2, Nausea with vomiting, unspecified  R10.84, Generalized abdominal pain                        K29.70, Gastritis, unspecified, without bleeding CPT copyright 2019 American Medical Association. All rights reserved. The codes documented in this report are preliminary and upon coder review may  be revised to meet current compliance requirements. [name] MD, MD 04/23/2021 9:18:10 AM This report has been signed electronically. Number of Addenda: 0 Note Initiated On: 04/23/2021 8:53 AM Total Procedure Duration: 0 hours 2 minutes 21 seconds  Estimated Blood Loss:  Estimated blood loss: none.      [facility]

## 2021-04-23 NOTE — Interval H&P Note (Signed)
Glenda Lame, MD [facility] [address], [place], [place] [address] Phone:[phone] Fax : [phone]  Primary Care Physician:  Remi Haggard, FNP Primary Gastroenterologist:  Dr. Allen Norris  Pre-Procedure History & Physical: HPI:  Glenda [name] is a 65 y.o. female is here for an endoscopy and colonoscopy.   Past Medical History:  Diagnosis Date   Abdominal pain, unspecified site    occasional   Anxiety state, unspecified    Cervicalgia    Chiari malformation    Chronic airway obstruction, not elsewhere classified    Chronic headache 02/26/2015   Chronic headaches    Migraines   Chronic migraine without aura, with intractable migraine, so stated, with status migrainosus 11/13/2017   Chronic periodontitis    Depressive disorder, not elsewhere classified    Difficulty sleeping    Dizziness    Esophageal reflux    Family history of adverse reaction to anesthesia    Daughter - Bladder is slow to function after anesthesia.   Gastroparesis    Lung infection 04/2013   Osteoporosis    Other and unspecified hyperlipidemia    Pancreatitis    PONV (postoperative nausea and vomiting)    Also, difficulty urinating after anesthesia.   Pseudotumor cerebri    Swelling of ankle    bilateral   Unspecified essential hypertension    no meds currently for HBP - has been on BP meds in past   Vertigo    Wears dentures    full upper and lower    Past Surgical History:  Procedure Laterality Date   BREAST BIOPSY Left 05/2014   neg   BREAST BIOPSY Left 2005   neg   Chiari Malformation Surgery  2009   [place]   CHOLECYSTECTOMY     LAPAROSCOPIC CHOLECYSTECTOMY  06/2013   [place]   MULTIPLE EXTRACTIONS WITH ALVEOLOPLASTY N/A 11/21/2013   Procedure: Extraction of tooth #'s 2,6,7,8,9,10,11,21,22,23,24,25,26,27,28,29, 32 with alveoloplasty and bilatreal mandibular tori reductions;  Surgeon: Lenn Cal, DDS;  Location: [facility];  Service: Oral Surgery;  Laterality: N/A;   TOTAL  ABDOMINAL HYSTERECTOMY  1995   [place]    Prior to Admission medications   Medication Sig Start Date End Date Taking? Authorizing Provider  acetaminophen (TYLENOL) 500 MG tablet Take 1,000 mg by mouth every 6 (six) hours as needed for mild pain, moderate pain or fever. Takes with Oxycodone and in between.   Yes [provider]  clonazePAM (KLONOPIN) 1 MG tablet Take 1 mg by mouth 2 (two) times daily as needed for anxiety.    Yes [provider]  cyanocobalamin (,VITAMIN B-12,) 1000 MCG/ML injection Inject 1,000 mcg into the muscle every 30 (thirty) days. Patient takes on the 17th of the month.   Yes [provider]  dexlansoprazole (DEXILANT) 60 MG capsule Take 60 mg by mouth daily.   Yes [provider]  diphenhydrAMINE (BENADRYL) 50 MG tablet Take 50 mg by mouth every 6 (six) hours as needed for itching. Takes with oxycodone to prevent itching.   Yes [provider]  LINZESS 290 MCG CAPS capsule Take 290 mcg by mouth daily as needed (constipation). 12/13/15  Yes [provider]  losartan (COZAAR) 50 MG tablet Take 50 mg by mouth every evening.   Yes [provider]  meclizine (ANTIVERT) 25 MG tablet Take 25 mg by mouth 3 (three) times daily as needed for dizziness.   Yes [provider]  oxyCODONE (ROXICODONE) 15 MG immediate release tablet Take 15 mg  by mouth 4 (four) times daily as needed for pain. 04/02/21  Yes [provider]  PARoxetine (PAXIL) 40 MG tablet Take 1 tablet (40 mg total) by mouth every morning. Patient taking differently: Take 40 mg by mouth every evening. 03/14/16  Yes Ravi, Himabindu, MD  potassium chloride SA (KLOR-CON) 20 MEQ tablet Take 20 mEq by mouth every evening. 09/11/19  Yes [provider]  promethazine (PHENERGAN) 25 MG tablet Take 25 mg by mouth every 6 (six) hours as needed for nausea or vomiting.   Yes [provider]  tiZANidine (ZANAFLEX) 4 MG tablet Take 4 mg by  mouth every 8 (eight) hours as needed for muscle spasms.   Yes [provider]  Vitamin D, Ergocalciferol, (DRISDOL) 1.25 MG (50000 UNIT) CAPS capsule Take 50,000 Units by mouth every 7 (seven) days.   Yes [provider]  dicyclomine (BENTYL) 20 MG tablet Take 1 tablet (20 mg total) by mouth 2 (two) times daily. Patient not taking: Reported on 04/21/2021 04/06/21   Evlyn Courier, PA-C  oxyCODONE (OXYCONTIN) 20 mg 12 hr tablet Take by mouth.    [provider]  Oxycodone HCl 10 MG TABS Take 10 mg by mouth 4 (four) times daily as needed. 02/03/21   [provider]    Allergies as of 03/30/2021 - Review Complete 03/30/2021  Allergen Reaction Noted   Doxycycline Itching 10/03/2013   Fentanyl Other (See Comments) 10/03/2013   Gabapentin  02/26/2015   Lyrica [pregabalin]  02/26/2015   Morphine and related Other (See Comments) 10/03/2013    Family History  Problem Relation Age of Onset   Diabetes Mother        type II   Cancer Mother        uterine   GER disease Mother    Osteoarthritis Mother    Breast cancer Mother 34   GER disease Father    Stroke Sister    Multiple sclerosis Sister    Cancer Maternal Grandmother    Cancer Maternal Grandfather    Heart disease Paternal Grandmother    Hematuria Paternal Grandmother    Heart disease Paternal Grandfather    Hematuria Brother    Urolithiasis Brother    Heart disease Brother    Kidney cancer Maternal Uncle    Breast cancer Maternal Aunt 30    Social History   Socioeconomic History   Marital status: Married    Spouse name: Not on file   Number of children: 2   Years of education: HS   Highest education level: Not on file  Occupational History    Comment: Disabled  Tobacco Use   Smoking status: Every Day    Packs/day: 0.75    Years: 45.00    Pack years: 33.75    Types: Cigarettes   Smokeless tobacco: Never   Tobacco comments:    Started smoking as teenager  Vaping Use   Vaping Use:  Never used  Substance and Sexual Activity   Alcohol use: No    Comment: Denies   Drug use: No   Sexual activity: Yes    Partners: Male    Birth control/protection: Post-menopausal, Surgical  Other Topics Concern   Not on file  Social History Narrative   Lives with husband   Patient drinks 8 cups of caffeine daily or less   Drinks a lot of water throughout the day   Patient is right handed.    Social Determinants of Health   Financial Resource Strain: Not on  file  Food Insecurity: Not on file  Transportation Needs: Not on file  Physical Activity: Not on file  Stress: Not on file  Social Connections: Not on file  Intimate Partner Violence: Not on file    Review of Systems: See HPI, otherwise negative ROS  Physical Exam: Ht 5\' 4"  (1.626 m)   Wt 63.5 kg   BMI 24.03 kg/m  General:   Alert,  pleasant and cooperative in NAD Head:  Normocephalic and atraumatic. Neck:  Supple; no masses or thyromegaly. Lungs:  Clear throughout to auscultation.    Heart:  Regular rate and rhythm. Abdomen:  Soft, nontender and nondistended. Normal bowel sounds, without guarding, and without rebound.   Neurologic:  Alert and  oriented x4;  grossly normal neurologically.  Impression/Plan: Glenda [name] is here for an endoscopy and colonoscopy to be performed for nausea and vomiting with abdominal pain  Risks, benefits, limitations, and alternatives regarding  endoscopy and colonoscopy have been reviewed with the patient.  Questions have been answered.  All parties agreeable.   Glenda Lame, MD  04/23/2021, 8:52 AM

## 2021-04-23 NOTE — Transfer of Care (Signed)
Immediate Anesthesia Transfer of Care Note  Patient: Glenda [name]  Procedure(s) Performed: COLONOSCOPY WITH PROPOFOL ESOPHAGOGASTRODUODENOSCOPY (EGD)  Patient Location: PACU  Anesthesia Type: General  Level of Consciousness: awake, alert  and patient cooperative  Airway and Oxygen Therapy: Patient Spontanous Breathing and Patient connected to supplemental oxygen  Post-op Assessment: Post-op Vital signs reviewed, Patient's Cardiovascular Status Stable, Respiratory Function Stable, Patent Airway and No signs of Nausea or vomiting  Post-op Vital Signs: Reviewed and stable  Complications: No notable events documented.

## 2021-04-26 ENCOUNTER — Encounter: Payer: Self-pay | Admitting: Gastroenterology

## 2021-04-26 LAB — SURGICAL PATHOLOGY

## 2021-04-27 ENCOUNTER — Telehealth: Payer: Self-pay

## 2021-04-27 NOTE — Telephone Encounter (Signed)
LVM for pt to return my call. Contacted April, pt's daughter to schedule appt.

## 2021-04-27 NOTE — Telephone Encounter (Signed)
-----   Message from [name], MD sent at 04/27/2021  8:46 AM EST ----- Please cancel this patient's appointment with Dr. Marius Ditch in March since she would like to follow-up with me.  The patient needs to be set up for an office visit to talk about the results of the biopsies.

## 2021-05-03 ENCOUNTER — Encounter: Payer: Self-pay | Admitting: Gastroenterology

## 2021-05-03 ENCOUNTER — Ambulatory Visit (INDEPENDENT_AMBULATORY_CARE_PROVIDER_SITE_OTHER): Payer: Medicare Other | Admitting: Gastroenterology

## 2021-05-03 ENCOUNTER — Other Ambulatory Visit: Payer: Self-pay

## 2021-05-03 VITALS — BP 148/91 | HR 112 | Temp 97.8°F | Ht 64.0 in | Wt 141.0 lb

## 2021-05-03 DIAGNOSIS — R112 Nausea with vomiting, unspecified: Secondary | ICD-10-CM

## 2021-05-03 NOTE — Progress Notes (Signed)
Primary Care Physician: Remi Haggard, FNP  Primary Gastroenterologist:  Dr. [name]  Chief Complaint  Patient presents with   Follow-up    Nausea and vomiting    HPI: Glenda [name] is a 65 y.o. female here for follow-up after having EGD and colonoscopy.  The patient was having chronic diarrhea and with nausea and vomiting.  The patient upper endoscopy showed gastric intestinal metaplasia and the colonoscopy showed normal colon mucosa in the terminal ileum and throughout the colon.  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  She has had these episodes of intermittent nausea with vomiting for years.  The patient's weight has been up and down with the symptoms and now is having her weight trending down.  The patient was taken off of her Phenergan and put on Zofran.  She reports that her symptoms are made worse with movement.  The patient was found to have no findings on the EGD to explain her nausea and vomiting and had her gallbladder removed in the past.  Past Medical History:  Diagnosis Date   Abdominal pain, unspecified site    occasional   Anxiety state, unspecified    Cervicalgia    Chiari malformation    Chronic airway obstruction, not elsewhere classified    Chronic headache 02/26/2015   Chronic headaches    Migraines   Chronic migraine without aura, with intractable migraine, so stated, with status migrainosus 11/13/2017   Chronic periodontitis    Depressive disorder, not elsewhere classified    Difficulty sleeping    Dizziness    Esophageal reflux    Family history of adverse reaction to anesthesia    Daughter - Bladder is slow to function after anesthesia.   Gastroparesis    Lung infection 04/2013   Osteoporosis    Other and unspecified hyperlipidemia    Pancreatitis    PONV (postoperative nausea and vomiting)    Also, difficulty urinating after anesthesia.   Pseudotumor cerebri    Swelling of  ankle    bilateral   Unspecified essential hypertension    no meds currently for HBP - has been on BP meds in past   Vertigo    Wears dentures    full upper and lower    Current Outpatient Medications  Medication Sig Dispense Refill   acetaminophen (TYLENOL) 500 MG tablet Take 1,000 mg by mouth every 6 (six) hours as needed for mild pain, moderate pain or fever. Takes with Oxycodone and in between.     clonazePAM (KLONOPIN) 1 MG tablet Take 1 mg by mouth 2 (two) times daily as needed for anxiety.      cyanocobalamin (,VITAMIN B-12,) 1000 MCG/ML injection Inject 1,000 mcg into the muscle every 30 (thirty) days. Patient takes on the 17th of the month.     dexlansoprazole (DEXILANT) 60 MG capsule Take 60 mg by mouth daily.     dicyclomine (BENTYL) 20 MG tablet Take 1 tablet (20 mg total) by mouth 2 (two) times daily. (Patient not taking: Reported on 04/21/2021) 20 tablet 0   diphenhydrAMINE (BENADRYL) 50 MG tablet Take 50 mg by mouth every 6 (six) hours as needed for itching. Takes with oxycodone to prevent itching.     LINZESS 290 MCG CAPS capsule Take 290 mcg by mouth daily as needed (constipation).     losartan (COZAAR) 50 MG tablet Take 50 mg by mouth every evening.     meclizine (ANTIVERT) 25 MG tablet  Take 25 mg by mouth 3 (three) times daily as needed for dizziness.     oxyCODONE (OXYCONTIN) 20 mg 12 hr tablet Take by mouth.     oxyCODONE (ROXICODONE) 15 MG immediate release tablet Take 15 mg by mouth 4 (four) times daily as needed for pain.     Oxycodone HCl 10 MG TABS Take 10 mg by mouth 4 (four) times daily as needed.     PARoxetine (PAXIL) 40 MG tablet Take 1 tablet (40 mg total) by mouth every morning. (Patient taking differently: Take 40 mg by mouth every evening.) 30 tablet 1   potassium chloride SA (KLOR-CON) 20 MEQ tablet Take 20 mEq by mouth every evening.     promethazine (PHENERGAN) 25 MG tablet Take 25 mg by mouth every 6 (six) hours as needed for nausea or vomiting.      tiZANidine (ZANAFLEX) 4 MG tablet Take 4 mg by mouth every 8 (eight) hours as needed for muscle spasms.     Vitamin D, Ergocalciferol, (DRISDOL) 1.25 MG (50000 UNIT) CAPS capsule Take 50,000 Units by mouth every 7 (seven) days.     No current facility-administered medications for this visit.    Allergies as of 05/03/2021 - Review Complete 04/23/2021  Allergen Reaction Noted   Bentyl [dicyclomine] Nausea And Vomiting 04/21/2021   Doxycycline Itching 10/03/2013   Fentanyl Other (See Comments) 10/03/2013   Gabapentin  02/26/2015   Lyrica [pregabalin]  02/26/2015   Morphine and related Other (See Comments) 10/03/2013   Nsaids  04/21/2021    ROS:  General: Negative for anorexia, weight loss, fever, chills, fatigue, weakness. ENT: Negative for hoarseness, difficulty swallowing , nasal congestion. CV: Negative for chest pain, angina, palpitations, dyspnea on exertion, peripheral edema.  Respiratory: Negative for dyspnea at rest, dyspnea on exertion, cough, sputum, wheezing.  GI: See history of present illness. GU:  Negative for dysuria, hematuria, urinary incontinence, urinary frequency, nocturnal urination.  Endo: Negative for unusual weight change.    Physical Examination:   BP (!) 148/91 (BP Location: Left Arm, Patient Position: Sitting, Cuff Size: Normal)    Pulse (!) 112    Temp 97.8 F (36.6 C) (Temporal)    Ht 5\' 4"  (1.626 m)    Wt 141 lb (64 kg)    BMI 24.20 kg/m   General: Well-nourished, well-developed in no acute distress.  Eyes: No icterus. Conjunctivae pink. Neuro: Alert and oriented x 3.  Grossly intact. Skin: Warm and dry, no jaundice.   Psych: Alert and cooperative, normal mood and affect.  Labs:    Imaging Studies: CT Abdomen Pelvis W Contrast  Result Date: 04/06/2021 CLINICAL DATA:  Abdominal pain.  History of gastroparesis. EXAM: CT ABDOMEN AND PELVIS WITH CONTRAST TECHNIQUE: Multidetector CT imaging of the abdomen and pelvis was performed using the standard  protocol following bolus administration of intravenous contrast. CONTRAST:  168mL OMNIPAQUE IOHEXOL 300 MG/ML  SOLN COMPARISON:  Multiple exams, including CT 06/11/2015 and MRI 07/10/2015 FINDINGS: Lower chest: Mild dependent subsegmental atelectasis in both lower lobes. Previous clustered nodularity at the right lung base is no longer appreciated but may be obscured in this subsegmental atelectasis. Small type 1 hiatal hernia. Hepatobiliary: Mild steatosis in segment 4 along the falciform ligament. Prior cholecystectomy. Common bile duct 10 mm in diameter, increased from previous 7 mm on 06/11/2015, but without intrahepatic biliary dilatation. Pancreas: Unremarkable Spleen: Unremarkable Adrenals/Urinary Tract: 2 mm left mid kidney nonobstructive renal calculus on image 28 series 3. 2.1 by 1.4 by 1.4 cm hypodense left  renal lesion has mild chronic complexity but does not change in density between portal venous and delayed phase images, and did not demonstrably enhance on the prior MRI of 07/10/2015, favoring a benign complex cyst. Urinary bladder unremarkable. 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 Upper normal amount of stool in the colon although with sparing of the distal colon. Collapsed distal small bowel. No current gastric distention. No small bowel dilatation. The appendix is not well seen. Vascular/Lymphatic: Atherosclerosis is present, including aortoiliac atherosclerotic disease. Central mesenteric vessels appear patent. No pathologic adenopathy. Reproductive: Uterus absent.  Adnexa unremarkable. Other: No supplemental non-categorized findings. Musculoskeletal: Lipoma deep to the right external oblique muscle as before. Mild lumbar degenerative disc disease without substantial observed impingement. IMPRESSION: 1.  Prominent stool throughout the colon favors constipation. 2. Currently normal caliber stomach. There is a small type 1 hiatal hernia along with a posterior  gastric diverticulum without findings of diverticular inflammation. 3. Complex but benign left mid kidney cystic lesion. 4. Mild prominence of the extra patent biliary tree with common bile duct diameter 10 mm, possibly a physiologic response to cholecystectomy. 5. Other imaging findings of potential clinical significance: Dependent subsegmental atelectasis in both lower lobes. Lumbar degenerative disc disease. 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 Atherosclerosis is present, including aortoiliac atherosclerotic disease. Nonobstructive left nephrolithiasis. Electronically Signed   By: Van Clines M.D.   On: 04/06/2021 16:02    Assessment and Plan:   Glenda [name] is a 65 y.o. y/o female who comes in today with a history of chronic nausea and vomiting that is also accompanied with diarrhea.  The patient had normal biopsies throughout the colon and terminal ileum without any sign of colitis.  The patient reports that her nausea vomiting is worse when she moves her head and has to sit still.  She has an appoint with neurology due to her history of a Budd-Chiari malformation and pseudotumor cerebri.  The patient has been told that I believe that her nausea vomiting is more likely a central cause that it is a GI cause.  The patient has been told that because she is having abdominal cramps she should start back on her dicyclomine which she had been given in the past.  The patient has been explained the plan agrees with it.     [name], MD. Marval Regal    Note: This dictation was prepared with Dragon dictation along with smaller phrase technology. Any transcriptional errors that result from this process are unintentional.

## 2021-06-01 ENCOUNTER — Other Ambulatory Visit: Payer: Self-pay | Admitting: Gastroenterology

## 2021-06-01 MED ORDER — DICYCLOMINE HCL 20 MG PO TABS: 20.0000 mg | ORAL_TABLET | Freq: Four times a day (QID) | ORAL | 5 refills | Status: DC

## 2021-06-01 NOTE — Telephone Encounter (Signed)
Pt stated at last OV with you she was advised to call the office to request a refill of Bentyl when she ran out... Pt is requesting a refill be sent to [facility]...    Please advise

## 2021-06-01 NOTE — Telephone Encounter (Signed)
Inbound call from pt requesting a call back stating that she was given samples of Bentyl and will need a prescription because she is out of medication. Thank you.

## 2021-06-01 NOTE — Telephone Encounter (Signed)
Do not see in Dr. Allen Norris note that a sample was given. Please advise

## 2021-06-03 NOTE — Telephone Encounter (Signed)
Rx sent through [facility]  

## 2021-06-26 ENCOUNTER — Emergency Department
Admission: EM | Admit: 2021-06-26 | Discharge: 2021-06-29 | Discharge status: Against Medical Advice | Payer: Medicare Other | Attending: Emergency Medicine | Admitting: Emergency Medicine

## 2021-06-26 ENCOUNTER — Other Ambulatory Visit: Payer: Self-pay

## 2021-06-26 DIAGNOSIS — R112 Nausea with vomiting, unspecified: Secondary | ICD-10-CM | POA: Diagnosis not present

## 2021-06-26 DIAGNOSIS — W228XXA Striking against or struck by other objects, initial encounter: Secondary | ICD-10-CM | POA: Diagnosis not present

## 2021-06-26 DIAGNOSIS — R109 Unspecified abdominal pain: Secondary | ICD-10-CM | POA: Diagnosis not present

## 2021-06-26 DIAGNOSIS — Y92000 Kitchen of unspecified non-institutional (private) residence as  the place of occurrence of the external cause: Secondary | ICD-10-CM | POA: Diagnosis not present

## 2021-06-26 DIAGNOSIS — R55 Syncope and collapse: Secondary | ICD-10-CM | POA: Insufficient documentation

## 2021-06-26 LAB — COMPREHENSIVE METABOLIC PANEL
ALT: 8 U/L (ref 0–44)
AST: 12 U/L — ABNORMAL LOW (ref 15–41)
Albumin: 3.7 g/dL (ref 3.5–5.0)
Alkaline Phosphatase: 74 U/L (ref 38–126)
Anion gap: 8 (ref 5–15)
BUN: 8 mg/dL (ref 8–23)
CO2: 27 mmol/L (ref 22–32)
Calcium: 9.3 mg/dL (ref 8.9–10.3)
Chloride: 98 mmol/L (ref 98–111)
Creatinine, Ser: 0.91 mg/dL (ref 0.44–1.00)
GFR, Estimated: >60 mL/min (ref >60)
Glucose, Bld: 91 mg/dL (ref 70–99)
Potassium: 4.3 mmol/L (ref 3.5–5.1)
Sodium: 133 mmol/L — ABNORMAL LOW (ref 135–145)
Total Bilirubin: 0.2 mg/dL — ABNORMAL LOW (ref 0.3–1.2)
Total Protein: 6.6 g/dL (ref 6.5–8.1)

## 2021-06-26 LAB — CBC
HCT: 40.2 % (ref 36.0–46.0)
Hemoglobin: 13.2 g/dL (ref 12.0–15.0)
MCH: 31.4 pg (ref 26.0–34.0)
MCHC: 32.8 g/dL (ref 30.0–36.0)
MCV: 95.7 fL (ref 80.0–100.0)
Platelets: 249 10*3/uL (ref 150–400)
RBC: 4.2 MIL/uL (ref 3.87–5.11)
RDW: 13.7 % (ref 11.5–15.5)
WBC: 5.8 10*3/uL (ref 4.0–10.5)
nRBC: 0 % (ref 0.0–0.2)

## 2021-06-26 LAB — LIPASE, BLOOD: Lipase: 30 U/L (ref 11–51)

## 2021-06-26 LAB — TROPONIN I (HIGH SENSITIVITY): Troponin I (High Sensitivity): 5 ng/L (ref <18)

## 2021-06-26 NOTE — ED Triage Notes (Signed)
Pt states had a syncopal episode tonight. Pt states has had several weeks of central abd pain, nausea and vomiting. Pt appears in no acute distress, denies diarrhea.

## 2021-06-26 NOTE — ED Triage Notes (Signed)
First nurse note: Pt to ED via EMS from home c/o abd pain for a couple months, got up to walk to kitchen and had syncopal episode hitting posterior head. Negative stroke screen per EMS, vitals 190/90 107 CBG, 97% RA, A&Ox4, chest rise even and unlabored, in NAD at this time.

## 2021-07-27 ENCOUNTER — Ambulatory Visit: Payer: Medicare Other | Admitting: Gastroenterology

## 2021-08-30 ENCOUNTER — Emergency Department (HOSPITAL_COMMUNITY)
Admission: EM | Admit: 2021-08-30 | Discharge: 2021-08-31 | Disposition: A | Discharge status: Home / Self Care | Payer: Medicare Other | Attending: Emergency Medicine | Admitting: Emergency Medicine

## 2021-08-30 ENCOUNTER — Other Ambulatory Visit: Payer: Self-pay

## 2021-08-30 ENCOUNTER — Encounter (HOSPITAL_COMMUNITY): Payer: Self-pay | Admitting: Emergency Medicine

## 2021-08-30 DIAGNOSIS — R112 Nausea with vomiting, unspecified: Secondary | ICD-10-CM | POA: Diagnosis present

## 2021-08-30 DIAGNOSIS — R197 Diarrhea, unspecified: Secondary | ICD-10-CM | POA: Insufficient documentation

## 2021-08-30 DIAGNOSIS — Z5321 Procedure and treatment not carried out due to patient leaving prior to being seen by health care provider: Secondary | ICD-10-CM | POA: Diagnosis not present

## 2021-08-30 LAB — COMPREHENSIVE METABOLIC PANEL
ALT: 9 U/L (ref 0–44)
AST: 14 U/L — ABNORMAL LOW (ref 15–41)
Albumin: 3.5 g/dL (ref 3.5–5.0)
Alkaline Phosphatase: 73 U/L (ref 38–126)
Anion gap: 5 (ref 5–15)
BUN: <5 mg/dL — ABNORMAL LOW (ref 8–23)
CO2: 25 mmol/L (ref 22–32)
Calcium: 8.6 mg/dL — ABNORMAL LOW (ref 8.9–10.3)
Chloride: 102 mmol/L (ref 98–111)
Creatinine, Ser: 1.06 mg/dL — ABNORMAL HIGH (ref 0.44–1.00)
GFR, Estimated: 59 mL/min — ABNORMAL LOW (ref >60)
Glucose, Bld: 88 mg/dL (ref 70–99)
Potassium: 4.2 mmol/L (ref 3.5–5.1)
Sodium: 132 mmol/L — ABNORMAL LOW (ref 135–145)
Total Bilirubin: 0.4 mg/dL (ref 0.3–1.2)
Total Protein: 5.7 g/dL — ABNORMAL LOW (ref 6.5–8.1)

## 2021-08-30 LAB — URINALYSIS, ROUTINE W REFLEX MICROSCOPIC
Bilirubin Urine: NEGATIVE
Glucose, UA: NEGATIVE mg/dL
Ketones, ur: NEGATIVE mg/dL
Nitrite: NEGATIVE
Protein, ur: NEGATIVE mg/dL
Specific Gravity, Urine: 1.002 — ABNORMAL LOW (ref 1.005–1.030)
pH: 6 (ref 5.0–8.0)

## 2021-08-30 LAB — CBC
HCT: 35 % — ABNORMAL LOW (ref 36.0–46.0)
Hemoglobin: 11.9 g/dL — ABNORMAL LOW (ref 12.0–15.0)
MCH: 32.7 pg (ref 26.0–34.0)
MCHC: 34 g/dL (ref 30.0–36.0)
MCV: 96.2 fL (ref 80.0–100.0)
Platelets: 308 10*3/uL (ref 150–400)
RBC: 3.64 MIL/uL — ABNORMAL LOW (ref 3.87–5.11)
RDW: 12.7 % (ref 11.5–15.5)
WBC: 6 10*3/uL (ref 4.0–10.5)
nRBC: 0 % (ref 0.0–0.2)

## 2021-08-30 LAB — LIPASE, BLOOD: Lipase: 29 U/L (ref 11–51)

## 2021-08-30 NOTE — ED Triage Notes (Signed)
C/o nausea, vomiting, and diarrhea since August.  Denies abd pain.  States PCP told her to come to ED. ?

## 2021-08-30 NOTE — ED Provider Triage Note (Signed)
Emergency Medicine Provider Triage Evaluation Note ? ?Glenda [name] , a 66 y.o. female  was evaluated in triage.  Pt complains of distant nausea, vomiting, diarrhea since August of last year.  Reports she follows with GI and has had EGD and colonoscopy without findings.  She does have a history of gastroparesis.  She does not see GI for another 2 weeks and did not feel she could wait any longer.  Reports her symptoms are getting worse.  She called her PCP who advised to come to the ED for possibility of admission for dehydration and to "figure out what is wrong." Pshx cholecystectomy. ? ?Review of Systems  ?Positive: + nausea, vomiting, diarrhea ?Negative:  ? ?Physical Exam  ?BP 108/64 (BP Location: Right Arm)   Pulse 95   Temp 98.7 ?F (37.1 ?C) (Oral)   Resp 18   SpO2 100%  ?Gen:   Awake, no distress   ?Resp:  Normal effort  ?MSK:   Moves extremities without difficulty  ?Other:  Abd soft and nontender ? ?Medical Decision Making  ?Medically screening exam initiated at 6:49 PM.  Appropriate orders placed.  KELISHA DALL was informed that the remainder of the evaluation will be completed by another provider, this initial triage assessment does not replace that evaluation, and the importance of remaining in the ED until their evaluation is complete. ? ? ?  ?Eustaquio Maize, PA-C ?08/30/21 1850 ? ?

## 2021-08-31 NOTE — ED Notes (Signed)
Pt called NAx2 ?

## 2021-09-13 ENCOUNTER — Ambulatory Visit (INDEPENDENT_AMBULATORY_CARE_PROVIDER_SITE_OTHER): Payer: Medicare Other | Admitting: Gastroenterology

## 2021-09-13 ENCOUNTER — Encounter: Payer: Self-pay | Admitting: Gastroenterology

## 2021-09-13 VITALS — BP 157/89 | HR 99 | Ht 64.0 in | Wt 134.0 lb

## 2021-09-13 DIAGNOSIS — R112 Nausea with vomiting, unspecified: Secondary | ICD-10-CM

## 2021-09-13 NOTE — Progress Notes (Signed)
? ? ?Primary Care Physician: Remi Haggard, FNP ? ?Primary Gastroenterologist:  Dr. [name] ? ?Chief Complaint  ?Patient presents with  ? Nausea  ?  Not able to keep food down  ? Weight Loss  ? ? ?HPI: Glenda [name] is a 66 y.o. female here with a history of nausea and vomiting and a report that the medication is not working.  The patient underwent an EGD and colonoscopy 6 months ago.  The biopsies showed reactive gastropathy with gastric intestinal metaplasia with normal colon biopsies and no cause for all of her symptoms being seen.  The biopsies were negative for H. pylori.  The terminal ileum and random colon biopsies were also normal. ? 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  As reported in my last note the patient has had the symptoms of nausea and vomiting for years.  The patient was also taken off of her Phenergan and put on Zofran and per my last note had reported that movement makes her symptoms worse. ? ?Previously the patient had been told that her symptoms were more consistent with a central cause than a GI cause. ? ?The patient continues to have nausea and even states she has had right now while in the office not associated with any eating or drinking.  The patient also reports that she has gastroparesis but when looking at her gastric emptying study it was inconclusive since she started vomiting after eating the eggs sandwich with barium and the procedure could not be completed. ? ? ?Past Medical History:  ?Diagnosis Date  ? Abdominal pain, unspecified site   ? occasional  ? Anxiety state, unspecified   ? Cervicalgia   ? Chiari malformation   ? Chronic airway obstruction, not elsewhere classified   ? Chronic headache 02/26/2015  ? Chronic headaches   ? Migraines  ? Chronic migraine without aura, with intractable migraine, so stated, with status migrainosus 11/13/2017  ? Chronic periodontitis   ? Depressive disorder, not elsewhere  classified   ? Difficulty sleeping   ? Dizziness   ? Esophageal reflux   ? Family history of adverse reaction to anesthesia   ? Daughter - Bladder is slow to function after anesthesia.  ? Gastroparesis   ? Lung infection 04/2013  ? Osteoporosis   ? Other and unspecified hyperlipidemia   ? Pancreatitis   ? PONV (postoperative nausea and vomiting)   ? Also, difficulty urinating after anesthesia.  ? Pseudotumor cerebri   ? Swelling of ankle   ? bilateral  ? Unspecified essential hypertension   ? no meds currently for HBP - has been on BP meds in past  ? Vertigo   ? Wears dentures   ? full upper and lower  ? ? ?Current Outpatient Medications  ?Medication Sig Dispense Refill  ? acetaminophen (TYLENOL) 500 MG tablet Take 1,000 mg by mouth every 6 (six) hours as needed for mild pain, moderate pain or fever. Takes with Oxycodone and in between.    ? clonazePAM (KLONOPIN) 1 MG tablet Take 1 mg by mouth 2 (two) times daily as needed for anxiety.     ? cyanocobalamin (,VITAMIN B-12,) 1000 MCG/ML injection Inject 1,000 mcg into the muscle every 30 (thirty) days. Patient takes on the 17th of the month.    ? dexlansoprazole (DEXILANT) 60 MG capsule Take 60 mg by mouth daily.    ? diphenhydrAMINE (BENADRYL) 50 MG tablet Take 50 mg by mouth every 6 (  six) hours as needed for itching. Takes with oxycodone to prevent itching.    ? LINZESS 290 MCG CAPS capsule Take 290 mcg by mouth daily as needed (constipation).    ? losartan (COZAAR) 50 MG tablet Take 50 mg by mouth every evening.    ? meclizine (ANTIVERT) 25 MG tablet Take 25 mg by mouth 3 (three) times daily as needed for dizziness.    ? ondansetron (ZOFRAN-ODT) 8 MG disintegrating tablet Take 8 mg by mouth every 6 (six) hours.    ? oxyCODONE (ROXICODONE) 15 MG immediate release tablet Take 15 mg by mouth 4 (four) times daily as needed for pain.    ? potassium chloride SA (KLOR-CON) 20 MEQ tablet Take 20 mEq by mouth every evening.    ? promethazine (PHENERGAN) 25 MG tablet Take 25  mg by mouth every 6 (six) hours as needed for nausea or vomiting.    ? tiZANidine (ZANAFLEX) 4 MG tablet Take 4 mg by mouth every 8 (eight) hours as needed for muscle spasms.    ? Vitamin D, Ergocalciferol, (DRISDOL) 1.25 MG (50000 UNIT) CAPS capsule Take 50,000 Units by mouth every 7 (seven) days.    ? ?No current facility-administered medications for this visit.  ? ? ?Allergies as of 09/13/2021 - Review Complete 09/13/2021  ?Allergen Reaction Noted  ? Bentyl [dicyclomine] Nausea And Vomiting 04/21/2021  ? Doxycycline Itching 10/03/2013  ? Fentanyl Other (See Comments) 10/03/2013  ? Gabapentin  02/26/2015  ? Lyrica [pregabalin]  02/26/2015  ? Morphine and related Other (See Comments) 10/03/2013  ? Nsaids  04/21/2021  ? ? ?ROS: ? ?General: Negative for anorexia, weight loss, fever, chills, fatigue, weakness. ?ENT: Negative for hoarseness, difficulty swallowing , nasal congestion. ?CV: Negative for chest pain, angina, palpitations, dyspnea on exertion, peripheral edema.  ?Respiratory: Negative for dyspnea at rest, dyspnea on exertion, cough, sputum, wheezing.  ?GI: See history of present illness. ?GU:  Negative for dysuria, hematuria, urinary incontinence, urinary frequency, nocturnal urination.  ?Endo: Negative for unusual weight change.  ?  ?Physical Examination: ? ? BP (!) 157/89   Pulse 99   Ht '5\' 4"'$  (1.626 m)   Wt 134 lb (60.8 kg)   BMI 23.00 kg/m?  ? ?General: Well-nourished, well-developed in no acute distress.  ?Eyes: No icterus. Conjunctivae pink. ?Neuro: Alert and oriented x 3.  Grossly intact. ?Skin: Warm and dry, no jaundice.   ?Psych: Alert and cooperative, normal mood and affect. ? ?Labs:  ?  ?Imaging Studies: ?No results found. ? ?Assessment and Plan:  ? ?Glenda [name] is a 66 y.o. y/o female who comes in today with a history of chronic nausea that is not associated with any particular foods.  The patient has a all the time and her symptoms are very consistent with a central cause of nausea since  it is worse with standing and moving and her EGD and colonoscopy did not show any sign of the cause for her symptoms.  The patient has been told to follow-up with her neurologist.  She has not had any relief with Zofran 8 mg or meclizine.  The patient has been told that I have very little more to offer her with her test not showing any GI cause for her symptoms.  The patient and her daughter have been explained the plan and agree with it. ? ? ? ? ?[name], MD. Marval Regal ? ? ? Note: This dictation was prepared with Dragon dictation along with smaller phrase technology. Any transcriptional errors that  result from this process are unintentional.  ?

## 2024-06-04 ENCOUNTER — Other Ambulatory Visit: Payer: Self-pay

## 2024-06-04 ENCOUNTER — Emergency Department
Admission: EM | Admit: 2024-06-04 | Discharge: 2024-06-06 | Disposition: A | Discharge status: Other Acute Inpatient Hospital | Attending: Emergency Medicine | Admitting: Emergency Medicine

## 2024-06-04 DIAGNOSIS — Z79899 Other long term (current) drug therapy: Secondary | ICD-10-CM | POA: Diagnosis not present

## 2024-06-04 DIAGNOSIS — I959 Hypotension, unspecified: Secondary | ICD-10-CM | POA: Diagnosis not present

## 2024-06-04 DIAGNOSIS — T50902A Poisoning by unspecified drugs, medicaments and biological substances, intentional self-harm, initial encounter: Secondary | ICD-10-CM | POA: Insufficient documentation

## 2024-06-04 DIAGNOSIS — R000 Tachycardia, unspecified: Secondary | ICD-10-CM | POA: Diagnosis not present

## 2024-06-04 DIAGNOSIS — D72829 Elevated white blood cell count, unspecified: Secondary | ICD-10-CM | POA: Diagnosis not present

## 2024-06-04 DIAGNOSIS — F17210 Nicotine dependence, cigarettes, uncomplicated: Secondary | ICD-10-CM | POA: Diagnosis not present

## 2024-06-04 DIAGNOSIS — F332 Major depressive disorder, recurrent severe without psychotic features: Secondary | ICD-10-CM | POA: Diagnosis present

## 2024-06-04 LAB — COMPREHENSIVE METABOLIC PANEL WITH GFR
ALT: 6 U/L (ref 0–44)
AST: 14 U/L — ABNORMAL LOW (ref 15–41)
Albumin: 3.4 g/dL — ABNORMAL LOW (ref 3.5–5.0)
Alkaline Phosphatase: 84 U/L (ref 38–126)
Anion gap: 13 (ref 5–15)
BUN: 10 mg/dL (ref 8–23)
CO2: 23 mmol/L (ref 22–32)
Calcium: 9 mg/dL (ref 8.9–10.3)
Chloride: 103 mmol/L (ref 98–111)
Creatinine, Ser: 1.09 mg/dL — ABNORMAL HIGH (ref 0.44–1.00)
GFR, Estimated: 56 mL/min — ABNORMAL LOW
Glucose, Bld: 114 mg/dL — ABNORMAL HIGH (ref 70–99)
Potassium: 2.8 mmol/L — ABNORMAL LOW (ref 3.5–5.1)
Sodium: 138 mmol/L (ref 135–145)
Total Bilirubin: 0.5 mg/dL (ref 0.0–1.2)
Total Protein: 5.7 g/dL — ABNORMAL LOW (ref 6.5–8.1)

## 2024-06-04 LAB — BASIC METABOLIC PANEL WITH GFR
Anion gap: 8 (ref 5–15)
BUN: 10 mg/dL (ref 8–23)
CO2: 23 mmol/L (ref 22–32)
Calcium: 8.2 mg/dL — ABNORMAL LOW (ref 8.9–10.3)
Chloride: 109 mmol/L (ref 98–111)
Creatinine, Ser: 0.99 mg/dL (ref 0.44–1.00)
GFR, Estimated: >60 mL/min
Glucose, Bld: 104 mg/dL — ABNORMAL HIGH (ref 70–99)
Potassium: 3.2 mmol/L — ABNORMAL LOW (ref 3.5–5.1)
Sodium: 141 mmol/L (ref 135–145)

## 2024-06-04 LAB — ACETAMINOPHEN LEVEL: Acetaminophen (Tylenol), Serum: <10 ug/mL — ABNORMAL LOW (ref 10–30)

## 2024-06-04 LAB — URINE DRUG SCREEN
Amphetamines: NEGATIVE
Barbiturates: NEGATIVE
Benzodiazepines: POSITIVE — AB
Cocaine: NEGATIVE
Fentanyl: NEGATIVE
Methadone Scn, Ur: NEGATIVE
Opiates: NEGATIVE
Tetrahydrocannabinol: NEGATIVE

## 2024-06-04 LAB — MAGNESIUM
Magnesium: 1.8 mg/dL (ref 1.7–2.4)
Magnesium: 2.6 mg/dL — ABNORMAL HIGH (ref 1.7–2.4)

## 2024-06-04 LAB — CBC
HCT: 34.2 % — ABNORMAL LOW (ref 36.0–46.0)
Hemoglobin: 11.9 g/dL — ABNORMAL LOW (ref 12.0–15.0)
MCH: 32.2 pg (ref 26.0–34.0)
MCHC: 34.8 g/dL (ref 30.0–36.0)
MCV: 92.4 fL (ref 80.0–100.0)
Platelets: 276 K/uL (ref 150–400)
RBC: 3.7 MIL/uL — ABNORMAL LOW (ref 3.87–5.11)
RDW: 13.9 % (ref 11.5–15.5)
WBC: 13 K/uL — ABNORMAL HIGH (ref 4.0–10.5)
nRBC: 0 % (ref 0.0–0.2)

## 2024-06-04 LAB — ETHANOL: Alcohol, Ethyl (B): <15 mg/dL

## 2024-06-04 LAB — CK: Total CK: 34 U/L — ABNORMAL LOW (ref 38–234)

## 2024-06-04 LAB — SALICYLATE LEVEL: Salicylate Lvl: <7 mg/dL — ABNORMAL LOW (ref 7.0–30.0)

## 2024-06-04 MED ORDER — SODIUM CHLORIDE 0.9 % IV BOLUS
1000.0000 mL | Freq: Once | INTRAVENOUS | Status: AC
  Administered 2024-06-04: 1000 mL via INTRAVENOUS

## 2024-06-04 MED ORDER — ONDANSETRON HCL 4 MG/2ML IJ SOLN: 4.0000 mg | Freq: Once | INTRAMUSCULAR | Status: DC

## 2024-06-04 MED ORDER — POTASSIUM CHLORIDE CRYS ER 20 MEQ PO TBCR
40.0000 meq | EXTENDED_RELEASE_TABLET | Freq: Once | ORAL | Status: AC
  Administered 2024-06-04: 40 meq via ORAL
  Filled 2024-06-04: qty 2

## 2024-06-04 MED ORDER — POTASSIUM CHLORIDE 10 MEQ/100ML IV SOLN
10.0000 meq | Freq: Once | INTRAVENOUS | Status: AC
  Administered 2024-06-04: 10 meq via INTRAVENOUS
  Filled 2024-06-04: qty 100

## 2024-06-04 MED ORDER — MAGNESIUM SULFATE 2 GM/50ML IV SOLN
2.0000 g | Freq: Once | INTRAVENOUS | Status: AC
  Administered 2024-06-04: 2 g via INTRAVENOUS
  Filled 2024-06-04: qty 50

## 2024-06-04 NOTE — ED Triage Notes (Signed)
 Pt to ED via [facility] from home for intentional OD on klonopin  and sedanidine. EMS reports pt was unresponsive on arrival and hypotensive 60/40. Ems gave 1mg  atropine given due to HR 40. perscription filled on 1/13. Pt takes 2 per day. EMS reports 17 pills remaining in bottle. Pt reports OD at 0300 this morning. EMS reports pt fell at unknown time and was found in the floor. unknown amount ingested.

## 2024-06-04 NOTE — ED Notes (Addendum)
Pt changed into psych clothes

## 2024-06-04 NOTE — ED Notes (Signed)
 Dinner tray provided to pt

## 2024-06-04 NOTE — ED Notes (Signed)
 This NT and NT Brittany assisted pt to the bathroom. Pt is back in the bed and back on the monitor. Pt requested a cup of water. NT Brittany provided pt with water.

## 2024-06-04 NOTE — ED Provider Notes (Signed)
 "  [facility] Provider Note    Event Date/Time   First MD Initiated Contact with Patient 06/04/24 [phone]     (approximate)  History   Chief Complaint: Drug Overdose  HPI  Glenda [name] is a 69 y.o. female with a past medical history of depression, gastric reflux, vertigo, anxiety, presents to the emergency department after an intentional overdose of clonazepam  and tizanidine.  According to the patient she states her husband is deceased she is depressed and just wanted to go to sleep.  Patient took an unknown quantity of both medications around 3 AM.  EMS states significant hypotension 60/40 on arrival they started the patient on Levophed gave approximately 250 cc of fluid and route to the hospital.  They also gave 1 mg of atropine due to a heart rate in the 40s.  Upon arrival patient is somnolent but awakens to voice she will answer most questions she admits to taking tizanidine and clonazepam  around 3:00 she does not know how many pills she took states she just wanted to go to sleep.  Patient denies any alcohol  use or any other substances.  Patient states she believes she did fall when she tried to get up.  Physical Exam   Triage Vital Signs: ED Triage Vitals  Encounter Vitals Group     BP 06/04/24 0947 (!) 89/74     Girls Systolic BP Percentile --      Girls Diastolic BP Percentile --      Boys Systolic BP Percentile --      Boys Diastolic BP Percentile --      Pulse Rate 06/04/24 0947 (!) 111     Resp 06/04/24 0947 (!) 31     Temp 06/04/24 0945 98.3 F (36.8 C)     Temp Source 06/04/24 0945 Axillary     SpO2 06/04/24 0947 100 %     Weight 06/04/24 0947 124 lb (56.2 kg)     Height 06/04/24 0947 5' 4 (1.626 m)     Head Circumference --      Peak Flow --      Pain Score 06/04/24 0947 0     Pain Loc --      Pain Education --      Exclude from Growth Chart --     Most recent vital signs: Vitals:   06/04/24 0947 06/04/24 1000  BP: (!) 89/74 (!) 71/50   Pulse: (!) 111 80  Resp: (!) 31 (!) 27  Temp:    SpO2: 100% 100%    General: Somnolent but awakens to voice will answer most questions but falls asleep if not being actively engaged dry appearing mucous membranes CV:  Good peripheral perfusion.  Regular rate and rhythm  Resp:  Normal effort.  Equal breath sounds bilaterally.  Abd:  No distention.  Soft, nontender.  No rebound or guarding. Other:  Good range of motion all extremities with no obvious pain elicited.   ED Results / Procedures / Treatments   EKG  EKG viewed and interpreted by myself shows sinus tachycardia at 118 bpm with a narrow QRS, normal axis, normal intervals besides slight QTc prolongation at 506 nonspecific ST changes without ST elevation.  Repeat EKG viewed and interpreted by myself shows a sinus rhythm at 57 bpm with a narrow QRS, normal axis, QTc prolongation at 593 otherwise reassuring intervals with no ST elevation.  MEDICATIONS ORDERED IN ED: Medications  ondansetron  (ZOFRAN ) injection 4 mg (has no administration in time range)  sodium chloride  0.9 % bolus 1,000 mL (1,000 mLs Intravenous New Bag/Given 06/04/24 0949)     IMPRESSION / MDM / ASSESSMENT AND PLAN / ED COURSE  I reviewed the triage vital signs and the nursing notes.  Patient's presentation is most consistent with acute presentation with potential threat to life or bodily function.  Patient presents to the emergency department after an intentional overdose of tizanidine and clonazepam .  Patient remains hypotensive currently 71/50  FINAL CLINICAL IMPRESSION(S) / ED DIAGNOSES   IVs have been obtained will infuse 2 L of normal saline.  If the patient remains hypotensive she may need further pressors.  Levophed was turned off upon EMS arrival.  Given the patient's fall we will obtain CT imaging of the head and C-spine.  We will check labs including an ethanol acetaminophen  and salicylate level.  I have placed the patient under IVC we will continue  to closely monitor and discussed likely with poison control.  Poison control recommends magnesium  and potassium supplementation.  We will dose IV magnesium  and potassium for the patient.  Lab work shows mild leukocytosis of 13,000 otherwise reassuring CBC chemistry shows slight hypokalemia at 2.8 otherwise reassuring.  Patient's blood pressure has improved with fluids currently 114/66.  Patient remains somnolent but awakens easily to voice will answer questions appropriately and interact and converse was able to go to the bathroom.  Patient's alcohol  acetaminophen  and salicylate levels are negative.  Poison control recommends continued monitoring for the time being given the patient's lower pulse rate we will discuss with poison control again in 4 hours for possible medical clearance.  Psychiatric evaluation pending.  Patient remains under IVC.  CRITICAL CARE Performed by: Franky Moores   Total critical care time: 30 minutes  Critical care time was exclusive of separately billable procedures and treating other patients.  Critical care was necessary to treat or prevent imminent or life-threatening deterioration.  Critical care was time spent personally by me on the following activities: development of treatment plan with patient and/or surrogate as well as nursing, discussions with consultants, evaluation of patient's response to treatment, examination of patient, obtaining history from patient or surrogate, ordering and performing treatments and interventions, ordering and review of laboratory studies, ordering and review of radiographic studies, pulse oximetry and re-evaluation of patient's condition.  Rx / DC Orders   Intentional overdose  Note:  This document was prepared using Dragon voice recognition software and may include unintentional dictation errors.   Moores Franky, MD 06/04/24 1454  "

## 2024-06-04 NOTE — ED Notes (Signed)
 RN spoke with Kristi at poison control. Poison control recommended giving more potassium and give magnesium . MD made aware of poison control recommendations.

## 2024-06-04 NOTE — ED Notes (Signed)
 Ivc papers filed

## 2024-06-04 NOTE — ED Notes (Signed)
 Lab called about add on magnesium .

## 2024-06-04 NOTE — ED Provider Notes (Signed)
" °  Physical Exam  BP 109/68   Pulse 63   Temp (!) 97.5 F (36.4 C) (Oral) Comment: warm blankets provided  Resp (!) 25   Ht 5' 4 (1.626 m)   Wt 56.2 kg   SpO2 100%   BMI 21.28 kg/m   Physical Exam  Procedures  Procedures  ED Course / MDM    Medical Decision Making Amount and/or Complexity of Data Reviewed Labs: ordered.  Risk Prescription drug management.   This patient's care was signed out to me at shift change pending repeat EKG and repeat lab work.  She initially presented after an intentional overdose on her clonazepam  and tizanidine.  The previous physician spoke with poison control who recommended magnesium  and potassium supplementation and repeat EKG and lab work.   ED ECG REPORT I, Reche CHRISTELLA Leventhal, the attending physician, personally viewed and interpreted this ECG.  Date: 06/04/2024 EKG Time: 17:06 Rate: 66 bpm Rhythm: normal sinus rhythm QRS Axis: normal Intervals: QT/QTc 629/660 ST/T Wave abnormalities: normal Narrative Interpretation: no evidence of acute ischemia  ED ECG REPORT I, Reche CHRISTELLA Leventhal, the attending physician, personally viewed and interpreted this ECG.  Date: 06/04/2024 EKG Time: 20:05 Rate: 75 bpm Rhythm: normal sinus rhythm QRS Axis: normal Intervals: QT/QTc 554/619 ST/T Wave abnormalities: normal Narrative Interpretation: no evidence of acute ischemia  Patient's potassium improved to 3.2 on repeat lab work however given her continued increase in QTc she was treated with oral potassium repletion.  Discussed EKG findings and lab work with poison control.  They recommend repeating EKG in 6 hours (2 AM) and if QTc continues to improve she will be medically cleared from their standpoint.      Leventhal Reche CHRISTELLA, MD 06/04/24 2310  "

## 2024-06-04 NOTE — ED Notes (Signed)
 Pt belongings:  Pink long sleeve shirt Pink tote bag Hair clip

## 2024-06-04 NOTE — ED Notes (Signed)
 IVC pending medical clearance and psych consult

## 2024-06-04 NOTE — ED Notes (Signed)
 MD made aware HR 48. Pt assessed at this time. Pt alert and oriented. MD at bedside.

## 2024-06-04 NOTE — ED Notes (Incomplete)
 RN spoke with poison control regarding overdose. Per poison control, repeat EKG in 4 hours and supportive care. Poison control recommends potassium and magnesium  correction as needed. MD made aware of poison control recommendations.

## 2024-06-04 NOTE — ED Notes (Signed)
 This NT assisted pt to the bathroom. Pt urinated. Pt back in bed and hooked back up to the monitor.

## 2024-06-04 NOTE — ED Notes (Signed)
 Lab called about add on magnesium  not being in process.

## 2024-06-05 DIAGNOSIS — F332 Major depressive disorder, recurrent severe without psychotic features: Secondary | ICD-10-CM

## 2024-06-05 MED ORDER — CYANOCOBALAMIN 1000 MCG/ML IJ SOLN
1000.0000 ug | INTRAMUSCULAR | Status: DC
  Administered 2024-06-05: 1000 ug via INTRAMUSCULAR
  Filled 2024-06-05 (×2): qty 1

## 2024-06-05 MED ORDER — LOSARTAN POTASSIUM 50 MG PO TABS
50.0000 mg | ORAL_TABLET | Freq: Every evening | ORAL | Status: DC
  Administered 2024-06-05: 50 mg via ORAL
  Filled 2024-06-05: qty 1

## 2024-06-05 MED ORDER — ACETAMINOPHEN 500 MG PO TABS: 1000.0000 mg | ORAL_TABLET | Freq: Four times a day (QID) | ORAL | Status: DC | PRN

## 2024-06-05 MED ORDER — OXYCODONE HCL 5 MG PO TABS: 15.0000 mg | ORAL_TABLET | Freq: Four times a day (QID) | ORAL | Status: DC | PRN

## 2024-06-05 MED ORDER — CLONAZEPAM 0.5 MG PO TABS: 0.5000 mg | ORAL_TABLET | Freq: Two times a day (BID) | ORAL | Status: DC | PRN

## 2024-06-05 MED ORDER — HYDROCHLOROTHIAZIDE 25 MG PO TABS
25.0000 mg | ORAL_TABLET | Freq: Every day | ORAL | Status: DC
  Administered 2024-06-05 – 2024-06-06 (×2): 25 mg via ORAL
  Filled 2024-06-05 (×3): qty 1

## 2024-06-05 MED ORDER — LOPERAMIDE HCL 2 MG PO CAPS
2.0000 mg | ORAL_CAPSULE | Freq: Once | ORAL | Status: AC
  Administered 2024-06-05: 2 mg via ORAL
  Filled 2024-06-05 (×2): qty 1

## 2024-06-05 MED ORDER — VITAMIN D (ERGOCALCIFEROL) 1.25 MG (50000 UNIT) PO CAPS
50000.0000 [IU] | ORAL_CAPSULE | ORAL | Status: DC
  Administered 2024-06-05: 50000 [IU] via ORAL
  Filled 2024-06-05 (×2): qty 1

## 2024-06-05 MED ORDER — LINACLOTIDE 145 MCG PO CAPS: 290.0000 ug | ORAL_CAPSULE | Freq: Every day | ORAL | Status: DC | PRN

## 2024-06-05 MED ORDER — PANTOPRAZOLE SODIUM 40 MG PO TBEC
40.0000 mg | DELAYED_RELEASE_TABLET | Freq: Every day | ORAL | Status: DC
  Administered 2024-06-05 – 2024-06-06 (×2): 40 mg via ORAL
  Filled 2024-06-05 (×2): qty 1

## 2024-06-05 NOTE — BH Assessment (Signed)
 Comprehensive Clinical Assessment (CCA) Screening, Triage and Referral Note  06/05/2024 Glenda [name] [id number] Recommendations for Services/Supports/Treatments: Iris consult/Disposition pending. Glenda [name] is a 69 year old, English speaking, Caucasian female. Pt presented to [facility] ED Voluntarily. Per triage note: Pt to ED via [facility] from home for intentional OD on klonopin  and sedanidine. EMS reports pt was unresponsive on arrival and hypotensive 60/40. Ems gave 1mg  atropine given due to HR 40. perscription filled on 1/13. Pt takes 2 per day. EMS reports 17 pills remaining in bottle. Pt reports OD at 0300 this morning. EMS reports pt fell at unknown time and was found in the floor. unknown amount ingested.  Upon assessment, pt. presented with I've been losing weight since October. My depression has been so bad.  Pt endorsed feeling depressed and experiencing SI for the last 3 years when her late husband was diagnosed with lung cancer. Pt reported that her husband of 48 years passed away in 2023/11/16. Pt was able to identify stressors that included lack of support, explaining that her son and daughter-in-law are unaware of her mental struggles, isolation, and unresolved grief Pt reported having intense emotional pain and hopelessness, explaining that she feels like she can't do it anymore. Pt reported that she has racing thoughts, trouble sleeping, crying spells, and self-care deficits. Pt described her thoughts as worrisome and intrusive. Pt did not appear to be responding to internal or external stimuli and pt. denied hallucinating. Thoughts were linear and relevant. Pt presented with a depressed mood; affect was congruent. Pt became tearful when discussing her worries and her grief emotions. Pt denied current HI or AV/H. The pt. denied using alcohol /other substances.  Chief Complaint:  Chief Complaint  Patient presents with   Drug Overdose   Visit Diagnosis: Major Depressive Disorder (MDD),  Recurrent, Severe, Without Psychotic Features  Patient Reported Information How did you hear about us ? No data recorded What Is the Reason for Your Visit/Call Today? No data recorded How Long Has This Been Causing You Problems? No data recorded What Do You Feel Would Help You the Most Today? No data recorded  Have You Recently Had Any Thoughts About Hurting Yourself? No data recorded Are You Planning to Commit Suicide/Harm Yourself At This time? No data recorded  Have you Recently Had Thoughts About Hurting Someone Sherral? No data recorded Are You Planning to Harm Someone at This Time? No data recorded Explanation: No data recorded  Have You Used Any Alcohol  or Drugs in the Past 24 Hours? No data recorded How Long Ago Did You Use Drugs or Alcohol ? No data recorded What Did You Use and How Much? No data recorded  Do You Currently Have a Therapist/Psychiatrist? No data recorded Name of Therapist/Psychiatrist: No data recorded  Have You Been Recently Discharged From Any Office Practice or Programs? No data recorded Explanation of Discharge From Practice/Program: No data recorded   CCA Screening Triage Referral Assessment Type of Contact: No data recorded Telemedicine Service Delivery:   Is this Initial or Reassessment?   Date Telepsych consult ordered in [facility]:    Time Telepsych consult ordered in [facility]:    Location of Assessment: No data recorded Provider Location: No data recorded   Collateral Involvement: No data recorded  Does Patient Have a Court Appointed Legal Guardian? No data recorded Name and Contact of Legal Guardian: No data recorded If Minor and Not Living with Parent(s), Who has Custody? No data recorded Is CPS involved or ever been involved? No data recorded Is APS  involved or ever been involved? No data recorded  Patient Determined To Be At Risk for Harm To Self or Others Based on Review of Patient Reported Information or Presenting Complaint? No data  recorded Method: No data recorded Availability of Means: No data recorded Intent: No data recorded Notification Required: No data recorded Additional Information for Danger to Others Potential: No data recorded Additional Comments for Danger to Others Potential: No data recorded Are There Guns or Other Weapons in Your Home? No data recorded Types of Guns/Weapons: No data recorded Are These Weapons Safely Secured?                            No data recorded Who Could Verify You Are Able To Have These Secured: No data recorded Do You Have any Outstanding Charges, Pending Court Dates, Parole/Probation? No data recorded Contacted To Inform of Risk of Harm To Self or Others: No data recorded  Does Patient Present under Involuntary Commitment? No data recorded   [place] of Residence: No data recorded  Patient Currently Receiving the Following Services: No data recorded  Determination of Need: No data recorded  Options For Referral: No data recorded  Disposition Recommendation per psychiatric provider: Pending psych consult  Amyrie Illingworth R Stanton Kissoon, LCAS

## 2024-06-05 NOTE — ED Notes (Signed)
 Pt moved to [facility] , is accepted to [facility] on 06/06/24

## 2024-06-05 NOTE — ED Notes (Signed)
IVC /psych consult pending 

## 2024-06-05 NOTE — Progress Notes (Signed)
 Per TTS, patient has been referred to the following facilities:  Service Provider Phone  [facility]  [phone]  [facility]  [phone]  [facility]  [phone]  [facility]  [phone]  [facility]  [phone]  [facility]  [phone]  [facility]  [phone]  [facility]  [phone]  [facility]  [phone]  [facility]  [phone]  [facility]  [phone]  [place] [facility]  [phone]  [facility]  [phone]  [facility]  [phone]  [facility]  [phone]  [facility]  [address] [place] [phone]

## 2024-06-05 NOTE — Progress Notes (Signed)
 Patient has been accepted to [facility] for 06/06/24. Patient was assigned to [address]. Accepting physician is Dr. Odella Cough. Call report [phone]. Representative was Chirico Soup.   ER Staff is aware of it: Olam,  ER Secretary Dr. Waymond, ER MD Cheron, Paramedic, Patient's Nurse   Address:  [address] [place], [place] [address]

## 2024-06-05 NOTE — ED Provider Notes (Signed)
.----------------------------------------- °  2:05 AM on 06/05/2024 -----------------------------------------  Blood pressure 123/69, pulse 97, temperature (!) 97.5 F (36.4 C), temperature source Oral, resp. rate 20, height 5' 4 (1.626 m), weight 56.2 kg, SpO2 100%.   In short, Glenda [name] is a 69 y.o. female with a chief complaint of ingestion.  Refer to the original H&P for additional details.  The current plan of care is to sign out pending repeat EKG at 2 AM, if QTc is no longer prolonged, she can be cleared for psych.  Repeat EKG done at 2 AM, sinus rhythm, rate 99, normal QRS, QTc is 519.  Will reach out to poison control for additional recommendations.  RN has reached out to poison control, they will close the case, states that she is medically cleared from their standpoint and that her QTc has improved significantly.  Will medically clear for psych.     Medications  ondansetron  (ZOFRAN ) injection 4 mg (4 mg Intravenous Not Given 06/04/24 1118)  sodium chloride  0.9 % bolus 1,000 mL (0 mLs Intravenous Stopped 06/04/24 1253)  sodium chloride  0.9 % bolus 1,000 mL (0 mLs Intravenous Stopped 06/04/24 1254)  potassium chloride  10 mEq in 100 mL IVPB (0 mEq Intravenous Stopped 06/04/24 1327)  magnesium  sulfate IVPB 2 g 50 mL (0 g Intravenous Stopped 06/04/24 1529)  potassium chloride  10 mEq in 100 mL IVPB (0 mEq Intravenous Stopped 06/04/24 1530)  sodium chloride  0.9 % bolus 1,000 mL (0 mLs Intravenous Stopped 06/04/24 2159)  potassium chloride  SA (KLOR-CON  M) CR tablet 40 mEq (40 mEq Oral Given 06/04/24 1845)     ED Discharge Orders     None      Final diagnoses:  Intentional overdose, initial encounter (HCC)      Waymond Lorelle Cummins, MD 06/05/24 [phone]

## 2024-06-05 NOTE — ED Provider Notes (Addendum)
 Patient reporting a headache.  Patient reports chronic migraines.  Denies any new red flag symptoms.  Denies any trauma to her head.  She reports seeing the pain clinic and is on a prescription for oxycodone .  I did review the database and patient is on chronic opioids that I did restart her home medications.  I did not restart any of the psychiatric medications that she is on multiple different psychiatric medications and I am not sure what their plan is.  I did send a message to [facility] to see if they can take a look at this and restart the psychiatric meds that they want to.  I did not want to risk her going through withdrawal and not having any of her benzos so this will need to be discussion with psychiatry    Ernest Ronal BRAVO, MD 06/05/24 1416    Ernest Ronal BRAVO, MD 06/05/24 1416

## 2024-06-05 NOTE — ED Notes (Signed)
 Patient given emotional support, shared resources for grief counseling. Pt accepted willingly. Stated that she regrets attempting suicide. Patient is religious and given suggested program of a christian based grief counseling called [email or web address] Calm and cooperative. No crying noted. Appeared to be interested in counseling.

## 2024-06-05 NOTE — ED Notes (Signed)
 Spoke to [facility] again, per their recommendations pt is to have Qtc <500. Repeat EKG shows Qtc of 519, and although less than their recommendations, given the significant decrease in the Qtc, they will clear her and close the case at this time.

## 2024-06-05 NOTE — ED Notes (Signed)
 Pt given dinner tray and beverage

## 2024-06-05 NOTE — ED Notes (Signed)
 The patient's daughter-in-law is concerned that the patient will not be completely forthcoming with all the information regarding the reason for this hospital visit and would like to be contacted in order to ensure proper care for the patient. I passed this information as well as the daughter-in-law's phone number along to the team.   Katheryn (daughter-in-law) [phone]

## 2024-06-05 NOTE — ED Notes (Signed)
 Pt provided with lunch tray and beverage

## 2024-06-05 NOTE — ED Notes (Signed)

## 2024-06-05 NOTE — Consult Note (Signed)
 [facility] Consult Note  Patient Name: Glenda [name] MRN: [id number] DOB: [birth year] DATE OF Consult: 06/05/2024 Consult Order details:  Orders (From admission, onward)     Start     Ordered   06/04/24 0951  CONSULT TO CALL ACT TEAM       Ordering Provider: Dorothyann Drivers, MD  Provider:  (Not yet assigned)  Question:  Reason for Consult?  Answer:  Psych consult   06/04/24 0950   06/04/24 0951  IP CONSULT TO PSYCHIATRY       Ordering Provider: Dorothyann Drivers, MD  Provider:  (Not yet assigned)  Question Answer Comment  Consult Timeframe ROUTINE - requires response within 24 hours   Reason for Consult? Consult for medication management   Contact phone number where the requesting provider can be reached [phone]      06/04/24 0950            PRIMARY PSYCHIATRIC DIAGNOSES  1.  Major depressive disorder recurrent severe without psychotic features.  RECOMMENDATIONS  Recommendations: Medication recommendations: No medication for now, medication management deferred to inpatient psych Non-Medication/therapeutic recommendations: Supportive care, suicide precautions We recommend inpatient psychiatric hospitalization after medical hospitalization. Patient has been involuntarily committed on 1/20.  Follow-Up Telepsychiatry C/L services: We will sign off for now. Please re-consult our service if needed for any concerning changes in the patient's condition, discharge planning, or questions. Communication: Treatment team members (and family members if applicable) who were involved in treatment/care discussions and planning, and with whom we spoke or engaged with via secure text/chat, include the following: Verla Amos, Paramedic    I personally spent a total of 35 minutes in the care of the patient today including preparing to see the patient, getting/reviewing separately obtained history, counseling and educating, referring and communicating with other health care  professionals, documenting clinical information in the EHR, and coordinating care.  Thank you for involving us  in the care of this patient. If you have any additional questions or concerns, please call [phone] and ask for me or the provider on-call.  TELEPSYCHIATRY ATTESTATION & CONSENT  As the provider for this telehealth consult, I attest that I verified the patients identity using two separate identifiers, introduced myself to the patient, provided my credentials, disclosed my location, and performed this encounter via a HIPAA-compliant, real-time, face-to-face, two-way, interactive audio and video platform and with the full consent and agreement of the patient (or guardian as applicable.)  Patient physical location: [place]. Telehealth provider physical location: home office in state of [place].  Video start time: 0800 AM (Central Time) Video end time: 0825 AM (Central Time)  IDENTIFYING DATA  Glenda [name] is a 69 y.o. year-old female for whom a psychiatric consultation has been ordered by the primary provider. The patient was identified using two separate identifiers.  CHIEF COMPLAINT/REASON FOR CONSULT  Intentional overdose on medications with suicidal intent  HISTORY OF PRESENT ILLNESS (HPI)  Psych consult requested for evaluation of 69 year old female with history of depression brought to the ED on 1/20 after intentional overdose on unknown amount of clonazepam  and tizanidine, was found unresponsive and hypotensive 60/40.  During evaluation patient is alert oriented, cooperative.  Reports she has been depressed for a while.  Denies any acute stressors, reports chronic stressors related to loss of her husband to cancer in June 2025, however states she has been struggling with severe depression prior.  Reports that she has been on several medications including Rexulti, Caplyta, with no improvement or experienced side effects.  Currently on Paxil  and Wellbutrin with no significant  improvement.  Endorses decreased motivation appetite energy level and recurrent suicidal thoughts.  Reports history of suicidal ideation, denies prior suicide attempt.  Reports history inpatient psychiatric hospitalization.  Patient denies symptoms suggestive of mania or psychosis.  PAST PSYCHIATRIC HISTORY   Otherwise as per HPI above.  PAST MEDICAL HISTORY  Past Medical History:  Diagnosis Date   Abdominal pain, unspecified site    occasional   Anxiety state, unspecified    Cervicalgia    Chiari malformation    Chronic airway obstruction, not elsewhere classified    Chronic headache 02/26/2015   Chronic headaches    Migraines   Chronic migraine without aura, with intractable migraine, so stated, with status migrainosus 11/13/2017   Chronic periodontitis    Depressive disorder, not elsewhere classified    Difficulty sleeping    Dizziness    Esophageal reflux    Family history of adverse reaction to anesthesia    Daughter - Bladder is slow to function after anesthesia.   Gastroparesis    Lung infection 04/2013   Osteoporosis    Other and unspecified hyperlipidemia    Pancreatitis    PONV (postoperative nausea and vomiting)    Also, difficulty urinating after anesthesia.   Pseudotumor cerebri    Swelling of ankle    bilateral   Unspecified essential hypertension    no meds currently for HBP - has been on BP meds in past   Vertigo    Wears dentures    full upper and lower     HOME MEDICATIONS  Facility Ordered Medications  Medication   [COMPLETED] sodium chloride  0.9 % bolus 1,000 mL   ondansetron  (ZOFRAN ) injection 4 mg   [COMPLETED] sodium chloride  0.9 % bolus 1,000 mL   [COMPLETED] potassium chloride  10 mEq in 100 mL IVPB   [COMPLETED] magnesium  sulfate IVPB 2 g 50 mL   [COMPLETED] potassium chloride  10 mEq in 100 mL IVPB   [COMPLETED] sodium chloride  0.9 % bolus 1,000 mL   [COMPLETED] potassium chloride  SA (KLOR-CON  M) CR tablet 40 mEq   PTA Medications   Medication Sig   clonazePAM  (KLONOPIN ) 1 MG tablet Take 1 mg by mouth 2 (two) times daily as needed for anxiety.    dexlansoprazole (DEXILANT) 60 MG capsule Take 60 mg by mouth daily.   acetaminophen  (TYLENOL ) 500 MG tablet Take 1,000 mg by mouth every 6 (six) hours as needed for mild pain, moderate pain or fever. Takes with Oxycodone  and in between.   promethazine  (PHENERGAN ) 25 MG tablet Take 25 mg by mouth every 6 (six) hours as needed for nausea or vomiting.   tiZANidine (ZANAFLEX) 4 MG tablet Take 4 mg by mouth every 8 (eight) hours as needed for muscle spasms.   cyanocobalamin  (,VITAMIN B-12,) 1000 MCG/ML injection Inject 1,000 mcg into the muscle every 30 (thirty) days. Patient takes on the 17th of the month.   potassium chloride  SA (KLOR-CON ) 20 MEQ tablet Take 20 mEq by mouth every evening.   oxyCODONE  (ROXICODONE ) 15 MG immediate release tablet Take 15 mg by mouth 4 (four) times daily as needed for pain.   Vitamin D , Ergocalciferol , (DRISDOL ) 1.25 MG (50000 UNIT) CAPS capsule Take 50,000 Units by mouth every 7 (seven) days.   meclizine (ANTIVERT) 25 MG tablet Take 25 mg by mouth 3 (three) times daily as needed for dizziness.   diphenhydrAMINE (BENADRYL) 50 MG tablet Take 50 mg by mouth every 6 (six) hours as needed for  itching. Takes with oxycodone  to prevent itching.   REXULTI 2 MG TABS tablet Take 2 mg by mouth daily.   buPROPion (WELLBUTRIN XL) 150 MG 24 hr tablet Take 150 mg by mouth daily.   hydrochlorothiazide  (HYDRODIURIL ) 25 MG tablet Take 25 mg by mouth daily.   PARoxetine  (PAXIL ) 40 MG tablet Take 40 mg by mouth daily.   LINZESS  290 MCG CAPS capsule Take 290 mcg by mouth daily as needed (constipation). (Patient not taking: Reported on 06/04/2024)   losartan  (COZAAR ) 50 MG tablet Take 50 mg by mouth every evening. (Patient not taking: Reported on 06/04/2024)   ondansetron  (ZOFRAN -ODT) 8 MG disintegrating tablet Take 8 mg by mouth every 6 (six) hours. (Patient not taking: Reported  on 06/04/2024)     ALLERGIES  Allergies[1]  SOCIAL & SUBSTANCE USE HISTORY  Social History   Socioeconomic History   Marital status: Married    Spouse name: Not on file   Number of children: 2   Years of education: HS   Highest education level: Not on file  Occupational History    Comment: Disabled  Tobacco Use   Smoking status: Every Day    Current packs/day: 0.75    Average packs/day: 0.8 packs/day for 45.0 years (33.8 ttl pk-yrs)    Types: Cigarettes   Smokeless tobacco: Never   Tobacco comments:    Started smoking as teenager  Vaping Use   Vaping status: Never Used  Substance and Sexual Activity   Alcohol  use: No    Comment: Denies   Drug use: No   Sexual activity: Yes    Partners: Male    Birth control/protection: Post-menopausal, Surgical  Other Topics Concern   Not on file  Social History Narrative   Lives with husband   Patient drinks 8 cups of caffeine daily or less   Drinks a lot of water throughout the day   Patient is right handed.    Social Drivers of Health   Tobacco Use: High Risk (06/04/2024)   Patient History    Smoking Tobacco Use: Every Day    Smokeless Tobacco Use: Never    Passive Exposure: Not on file  Financial Resource Strain: Not on file  Food Insecurity: Not on file  Transportation Needs: Not on file  Physical Activity: Not on file  Stress: Not on file  Social Connections: Not on file  Depression (EYV7-0): Not on file  Alcohol  Screen: Not on file  Housing: Not on file  Utilities: Not on file  Health Literacy: Not on file   Tobacco Use History[2] Social History   Substance and Sexual Activity  Alcohol  Use No   Comment: Denies   Social History   Substance and Sexual Activity  Drug Use No    Additional pertinent information .  FAMILY HISTORY  Family History  Problem Relation Age of Onset   Diabetes Mother        type II   Cancer Mother        uterine   GER disease Mother    Osteoarthritis Mother    Breast cancer  Mother 64   GER disease Father    Stroke Sister    Multiple sclerosis Sister    Cancer Maternal Grandmother    Cancer Maternal Grandfather    Heart disease Paternal Grandmother    Hematuria Paternal Grandmother    Heart disease Paternal Grandfather    Hematuria Brother    Urolithiasis Brother    Heart disease Brother    Kidney cancer  Maternal Uncle    Breast cancer Maternal Aunt 30   Family Psychiatric History (if known):    MENTAL STATUS EXAM (MSE)  Mental Status Exam: General Appearance: Fairly Groomed  Orientation:  Full (Time, Place, and Person)  Memory:  Immediate;   Fair Recent;   Fair Remote;   Fair  Concentration:  Concentration: Fair and Attention Span: Fair  Recall:  Fair  Attention  Fair  Eye Contact:  Fair  Speech:  Clear and Coherent  Language:  Good  Volume:  Normal  Mood: depressed  Affect:  Congruent  Thought Process:  Coherent  Thought Content:  Logical  Suicidal Thoughts:  recent suicide attempt  Homicidal Thoughts:  No  Judgement:  Impaired  Insight:  Fair  Psychomotor Activity:  Normal  Akathisia:  No  Fund of Knowledge:  Fair    Assets:  Housing Social Support  Cognition:  WNL  ADL's:  Intact  AIMS (if indicated):       VITALS  Blood pressure 137/80, pulse 86, temperature 97.7 F (36.5 C), temperature source Oral, resp. rate 18, height 5' 4 (1.626 m), weight 56.2 kg, SpO2 98%.  LABS  Admission on 06/04/2024  Component Date Value Ref Range Status   WBC 06/04/2024 13.0 (H)  4.0 - 10.5 K/uL Final   RBC 06/04/2024 3.70 (L)  3.87 - 5.11 MIL/uL Final   Hemoglobin 06/04/2024 11.9 (L)  12.0 - 15.0 g/dL Final   HCT 98/79/7973 34.2 (L)  36.0 - 46.0 % Final   MCV 06/04/2024 92.4  80.0 - 100.0 fL Final   MCH 06/04/2024 32.2  26.0 - 34.0 pg Final   MCHC 06/04/2024 34.8  30.0 - 36.0 g/dL Final   RDW 98/79/7973 13.9  11.5 - 15.5 % Final   Platelets 06/04/2024 276  150 - 400 K/uL Final   nRBC 06/04/2024 0.0  0.0 - 0.2 % Final   Performed at  [facility], [address] [place], [place] [address]   Sodium 06/04/2024 138  135 - 145 mmol/L Final   Potassium 06/04/2024 2.8 (L)  3.5 - 5.1 mmol/L Final   Chloride 06/04/2024 103  98 - 111 mmol/L Final   CO2 06/04/2024 23  22 - 32 mmol/L Final   Glucose, Bld 06/04/2024 114 (H)  70 - 99 mg/dL Final   Glucose reference range applies only to samples taken after fasting for at least 8 hours.   BUN 06/04/2024 10  8 - 23 mg/dL Final   Creatinine, Ser 06/04/2024 1.09 (H)  0.44 - 1.00 mg/dL Final   Calcium 98/79/7973 9.0  8.9 - 10.3 mg/dL Final   Total Protein 98/79/7973 5.7 (L)  6.5 - 8.1 g/dL Final   Albumin 98/79/7973 3.4 (L)  3.5 - 5.0 g/dL Final   AST 98/79/7973 14 (L)  15 - 41 U/L Final   ALT 06/04/2024 6  0 - 44 U/L Final   Alkaline Phosphatase 06/04/2024 84  38 - 126 U/L Final   Total Bilirubin 06/04/2024 0.5  0.0 - 1.2 mg/dL Final   GFR, Estimated 06/04/2024 56 (L)  >60 mL/min Final   Comment: (NOTE) Calculated using the CKD-EPI Creatinine Equation (2021)    Anion gap 06/04/2024 13  5 - 15 Final   Performed at [facility], [address] [place], [place] [address]   Total CK 06/04/2024 34 (L)  38 - 234 U/L Final   Performed at [facility], [address] [place], [place] [address]   Alcohol , Ethyl (  B) 06/04/2024 <15  <15 mg/dL Final   Comment: (NOTE) For medical purposes only. Performed at [facility], [address] [place], [place] [address]    Acetaminophen  (Tylenol ), Serum 06/04/2024 <10 (L)  10 - 30 ug/mL Final   Comment: (NOTE) Toxic concentrations can be more effectively related to post dose interval; >200, >100, and >50 ug/mL serum concentrations correspond to toxic concentrations at 4, 8, and 12 hours post dose, respectively.  Performed at [facility], [address] [place], [place] [address]    Salicylate Lvl 06/04/2024 <7.0 (L)  7.0 - 30.0 mg/dL Final   Performed at [facility], [address] [place], [place] [address]   Opiates 06/04/2024 NEGATIVE  NEGATIVE Final   Cocaine  06/04/2024 NEGATIVE  NEGATIVE Final   Benzodiazepines 06/04/2024 POSITIVE (A)  NEGATIVE Final   Amphetamines 06/04/2024 NEGATIVE  NEGATIVE Final   Tetrahydrocannabinol 06/04/2024 NEGATIVE  NEGATIVE Final   Barbiturates 06/04/2024 NEGATIVE  NEGATIVE Final   Methadone  Scn, Ur 06/04/2024 NEGATIVE  NEGATIVE Final   Fentanyl  06/04/2024 NEGATIVE  NEGATIVE Final   Comment: (NOTE) Drug screen is for Medical Purposes only. Positive results are preliminary only. If confirmation is needed, notify lab within 5 days.  Drug Class                 Cutoff (ng/mL) Amphetamine and metabolites 1000 Barbiturate and metabolites 200 Benzodiazepine              200 Opiates and metabolites     300 Cocaine  and metabolites     300 THC                         50 Fentanyl                     5 Methadone                    300  Trazodone is metabolized in vivo to several metabolites,  including pharmacologically active m-CPP, which is excreted in the  urine.  Immunoassay screens for amphetamines and MDMA have potential  cross-reactivity with these compounds and may provide false positive  result.  Performed at [facility], [address] [place], [place] [address]    Magnesium  06/04/2024 1.8  1.7 - 2.4 mg/dL Final   Performed at [facility], [address] [place], [place] [address]   Magnesium  06/04/2024 2.6 (H)  1.7 - 2.4 mg/dL Final   Performed at [facility], [address] [place], [place] [address]   Sodium 06/04/2024 141  135 - 145 mmol/L Final   Potassium 06/04/2024 3.2 (L)  3.5 - 5.1 mmol/L Final   Chloride 06/04/2024 109  98 - 111 mmol/L Final   CO2 06/04/2024 23  22 - 32 mmol/L Final   Glucose, Bld 06/04/2024 104 (H)  70 - 99 mg/dL Final   Glucose reference range applies only to samples taken after fasting for at least 8 hours.   BUN 06/04/2024 10  8 - 23 mg/dL  Final   Creatinine, Ser 06/04/2024 0.99  0.44 - 1.00 mg/dL Final   Calcium 98/79/7973 8.2 (L)  8.9 - 10.3 mg/dL Final   GFR, Estimated 06/04/2024 >60  >60 mL/min Final   Comment: (NOTE) Calculated using the CKD-EPI Creatinine Equation (2021)    Anion gap 06/04/2024 8  5 - 15 Final   Performed at [facility], [address]  [place], [place] [address]    PSYCHIATRIC REVIEW OF SYSTEMS (ROS)  ROS: Notable for the following relevant positive findings: ROS  Additional findings:      Musculoskeletal: No abnormal movements observed      Gait & Station: Laying/Sitting      Pain Screening: Denies      Nutrition & Dental Concerns: none  RISK FORMULATION/ASSESSMENT  Is the patient experiencing any suicidal or homicidal ideations: Yes       Explain if yes: Recent suicide attempt Protective factors considered for safety management:  In hospital, constant observation  Risk factors/concerns considered for safety management:  Depression Physical illness/chronic pain Recent loss Age over 70 Hopelessness Unmarried  Is there a safety management plan with the patient and treatment team to minimize risk factors and promote protective factors: Yes           Explain: Inpatient psychiatric stabilization Is crisis care placement or psychiatric hospitalization recommended: Yes     Based on my current evaluation and risk assessment, patient is determined at this time to be at:  High risk  *RISK ASSESSMENT Risk assessment is a dynamic process; it is possible that this patient's condition, and risk level, may change. This should be re-evaluated and managed over time as appropriate. Please re-consult psychiatric consult services if additional assistance is needed in terms of risk assessment and management. If your team decides to discharge this patient, please advise the patient how to best access emergency psychiatric services, or to call 911, if their condition worsens or they feel unsafe in  any way.   Tyshan Enderle D Gabrielly Mccrystal, MD Telepsychiatry Consult Services    [1]  Allergies Allergen Reactions   Bentyl  [Dicyclomine ] Nausea And Vomiting   Doxycycline Itching   Fentanyl  Other (See Comments)    made me hyper   Gabapentin     Patient states it put her in another world   Lyrica [Pregabalin]     Patient states it put her in another world   Morphine  And Codeine Other (See Comments)    chest pain   Nsaids     GI distress  [2]  Social History Tobacco Use  Smoking Status Every Day   Current packs/day: 0.75   Average packs/day: 0.8 packs/day for 45.0 years (33.8 ttl pk-yrs)   Types: Cigarettes  Smokeless Tobacco Never  Tobacco Comments   Started smoking as teenager

## 2024-06-05 NOTE — ED Notes (Signed)
 Patient given snack at the bedside. No acute needs at this time.

## 2024-06-05 NOTE — ED Notes (Signed)
 Accepted to [facility]. Transport possible tomorrow.

## 2024-06-06 NOTE — ED Provider Notes (Signed)
 Emergency Medicine Observation Re-evaluation Note  Glenda [name] is a 69 y.o. female, seen on rounds today.  Pt initially presented to the ED for complaints of Drug Overdose Currently, the patient is resting  Physical Exam  BP 131/82   Pulse 93   Temp 98.7 F (37.1 C)   Resp 15   Ht 5' 4 (1.626 m)   Wt 56.2 kg   SpO2 100%   BMI 21.28 kg/m  Physical Exam General: No acute distress Cardiac: Warm and well-perfused Lungs: Resting comfortably   ED Course / MDM  EKG:EKG Interpretation Date/Time:  Wednesday June 05 2024 02:01:31 EST Ventricular Rate:  99 PR Interval:  144 QRS Duration:  72 QT Interval:  404 QTC Calculation: 519 R Axis:   87  Text Interpretation: Sinus rhythm Borderline right axis deviation Prolonged QT interval Confirmed by UNCONFIRMED, DOCTOR ([id number]), editor Alex Slough [phone]) on 06/05/2024 7:56:59 AM  I have reviewed the labs performed to date as well as medications administered while in observation.  Recent changes in the last 24 hours include no acute events overnight, may be transferred today  Plan  Current plan is for inpatient psychiatry disposition    Nicholaus Rolland BRAVO, MD 06/06/24 [phone]

## 2024-06-06 NOTE — ED Notes (Signed)
 IVC/Accepted to [facility]

## 2024-06-06 NOTE — ED Notes (Signed)
Door unlocked for pt to use bathroom. Door locked once pt finished.  °

## 2024-06-06 NOTE — ED Notes (Signed)
 EMTALA reviewed by this RN.

## 2024-06-06 NOTE — ED Notes (Signed)
 IVC spoke to sheriff they will have transport to [facility] today,  called in already to c com

## 2024-06-06 NOTE — ED Provider Notes (Signed)
 Emergency Medicine Observation Re-evaluation Note   BP 131/82   Pulse 93   Temp 98.7 F (37.1 C)   Resp 15   Ht 5' 4 (1.626 m)   Wt 56.2 kg   SpO2 100%   BMI 21.28 kg/m    ED Course / MDM   No reported events during my shift at the time of this note.   Pt is awaiting dispo from consultants   Ginnie Shams MD    Shams Ginnie, MD 06/06/24 0111

## 2024-06-06 NOTE — ED Notes (Signed)
 Pt transferred to [facility] at this time by [facility]. Pt aware of admission and transfer. Pt calm and cooperative. All belongings sent with [facility]. Report has been called.
